# Patient Record
Sex: Female | Born: 1990 | Race: White | Hispanic: Yes | Marital: Single | Smoking: Current some day smoker
Health system: Southern US, Community
[De-identification: ages and names within clinical notes are randomized; demographics above are authoritative.]

## PROBLEM LIST (undated history)

## (undated) ENCOUNTER — Inpatient Hospital Stay (HOSPITAL_COMMUNITY): Payer: Self-pay

## (undated) DIAGNOSIS — M722 Plantar fascial fibromatosis: Secondary | ICD-10-CM

## (undated) DIAGNOSIS — M7120 Synovial cyst of popliteal space [Baker], unspecified knee: Secondary | ICD-10-CM

## (undated) DIAGNOSIS — Z789 Other specified health status: Secondary | ICD-10-CM

## (undated) DIAGNOSIS — F419 Anxiety disorder, unspecified: Secondary | ICD-10-CM

## (undated) HISTORY — PX: NO PAST SURGERIES: SHX2092

## (undated) HISTORY — DX: Synovial cyst of popliteal space (Baker), unspecified knee: M71.20

## (undated) HISTORY — PX: CHOLECYSTECTOMY: SHX55

## (undated) HISTORY — DX: Anxiety disorder, unspecified: F41.9

## (undated) HISTORY — PX: OTHER SURGICAL HISTORY: SHX169

## (undated) HISTORY — DX: Plantar fascial fibromatosis: M72.2

---
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
## 2009-08-20 ENCOUNTER — Other Ambulatory Visit: Admission: RE | Admit: 2009-08-20 | Discharge: 2009-08-20 | Payer: Self-pay | Admitting: Unknown Physician Specialty

## 2010-06-27 NOTE — Op Note (Signed)
[name], [name]             ACCOUNT NO.:  [id number]   MEDICAL RECORD NO.:  [id number]          PATIENT TYPE:  AMB   LOCATION:  DAY                          FACILITY:  [facility]   PHYSICIAN:  Wilmon Arms. Corliss Skains, M.D. DATE OF BIRTH:  10-May-1990   DATE OF PROCEDURE:  03/13/2005  DATE OF DISCHARGE:                                 OPERATIVE REPORT   PREOPERATIVE DIAGNOSES:  1.  Cholelithiasis,  2.  Recent gallstone pancreatitis.   POSTOPERATIVE DIAGNOSES:  1.  Cholelithiasis,  2.  Recent gallstone pancreatitis.   PROCEDURE PERFORMED:  Laparoscopic cholecystectomy with intraoperative  cholangiogram.   SURGEON:  Wilmon Arms. Corliss Skains, M.D.   ASSISTANT:  Adolph Pollack, M.D.   ANESTHESIA:  General endotracheal.   INDICATIONS:  The patient is a 20 year old female who recently developed  severe right upper quadrant epigastric abdominal pain associated with some  subjective fever. The whites of her eyes also turned yellowish. She was  evaluated by her family physician and was noted to have an elevated  bilirubin as well as high amylase and lipase. Her Helicobacter pylori titer  was also positive. She was treated with oral antibiotics and proton pump  inhibitors. Her liver functions as well as amylase and lipase have  fortunately return to normal. She continues to have some right upper  quadrant pain with occasional nausea and vomiting, but this has slightly  improved. Ultrasound showed a large number of small gallstones with no  gallbladder wall thickening.   DESCRIPTION OF PROCEDURE:  The patient was brought to the operating room and  placed in the supine position on the operating room table. After an adequate  level of general endotracheal anesthesia was obtained, the patient's abdomen  was prepped with Betadine and draped in a sterile fashion. A time-out was  taken to ensure the proper patient and proper procedure. The area below her  umbilicus was infiltrated with 0.25%  Marcaine. A transverse incision was  made below her umbilicus and dissection was carried down to the fascia. The  fascia was grasped with Kocher clamps and elevated. A vertical incision was  made. The peritoneal cavity was entered bluntly. A pursestring suture of #0  Vicryl was placed around the fascial opening. A Hasson cannula was inserted  and secured with the pursestring suture. Pneumoperitoneum was obtained by  insufflating CO2 and maintaining maximal pressure of 15 mmHg. The patient  was rotated into reverse Trendelenburg position slightly to her left. The  video laparoscope was inserted and a quick visual examination of the abdomen  showed a noninflamed appearing gallbladder, normal appearing stomach and  spleen. The patient had some physiologic fluid in the pelvis. A 10-mm port  was placed in the subxiphoid position in the midline. Two 5-mm ports were  placed in the right upper quadrant. The gallbladder was grasped with clamps  and elevated up over the edge of the liver. The gallbladder was very long  and contained some stones. The peritoneum around the hilum of the  gallbladder was opened. The cystic duct was identified and circumferentially  dissected. This was ligated with a clip distally. A  small opening was  created on the cystic duct and a Cook cholangiogram catheter was brought  through a separate stab incision and inserted the cystic duct. It was  secured with a clip and a cholangiogram was taken. This showed a tortuous  cystic duct with good flow proximally and distally in the common bile duct  and the duodenum.  There was some question of a possible filling defect just  below the cystic duct, common duct junction, but this appeared to be  overlying gas. The cholangiogram catheter was then flushed with saline and  the cholangiogram was then repeated. No further filling defect was noted.  The cholangiogram catheter was then removed. The cystic duct was ligated  with clips  and divided. Two branches of the cystic artery were ligated with  clips and divided. Cautery was then used to dissect the gallbladder away  from the liver bed. A small portion of liver parenchyma was taken with the  gallbladder. Once the gallbladder was removed, cautery was used to achieve  hemostasis in the liver bed. The pieces of Surgicel were placed in the liver  bed. The gallbladder was placed in an EndoCatch sac and removed through the  umbilical port. The irrigant was suctioned out from the right upper  quadrant. Pneumoperitoneum was released as the ports were removed under  direct vision. The pursestring suture was used to close the umbilical  fascia. The wounds were all closed with 4-0 Monocryl in subcuticular  fashion. Steri-Strips and clean dressings were applied. The patient was  extubated and brought to the recovery room in stable condition. All sponge,  instrument and needle counts were correct.      Wilmon Arms. Tsuei, M.D.  Electronically Signed     MKT/MEDQ  D:  03/13/2005  T:  03/13/2005  Job:  [id number]

## 2010-11-12 ENCOUNTER — Emergency Department (HOSPITAL_COMMUNITY)
Admission: EM | Admit: 2010-11-12 | Discharge: 2010-11-12 | Disposition: A | Discharge status: Home / Self Care | Payer: Self-pay | Attending: Emergency Medicine | Admitting: Emergency Medicine

## 2010-11-12 ENCOUNTER — Emergency Department (HOSPITAL_COMMUNITY): Payer: Self-pay

## 2010-11-12 DIAGNOSIS — R509 Fever, unspecified: Secondary | ICD-10-CM | POA: Insufficient documentation

## 2010-11-12 DIAGNOSIS — R071 Chest pain on breathing: Secondary | ICD-10-CM | POA: Insufficient documentation

## 2010-11-12 LAB — DIFFERENTIAL
Basophils Relative: 0 % (ref 0–1)
Eosinophils Relative: 1 % (ref 0–5)
Lymphocytes Relative: 15 % (ref 12–46)
Neutrophils Relative %: 75 % (ref 43–77)

## 2010-11-12 LAB — D-DIMER, QUANTITATIVE: D-Dimer, Quant: 1.63 ug/mL-FEU — ABNORMAL HIGH (ref 0.00–0.48)

## 2010-11-12 LAB — CBC
HCT: 33.7 % — ABNORMAL LOW (ref 36.0–46.0)
Hemoglobin: 12 g/dL (ref 12.0–15.0)
MCH: 29.8 pg (ref 26.0–34.0)
MCHC: 35.6 g/dL (ref 30.0–36.0)
MCV: 83.6 fL (ref 78.0–100.0)
Platelets: 160 10*3/uL (ref 150–400)

## 2010-11-12 LAB — POCT I-STAT, CHEM 8
BUN: 11 mg/dL (ref 6–23)
Calcium, Ion: 1.14 mmol/L (ref 1.12–1.32)
Chloride: 104 mEq/L (ref 96–112)
HCT: 36 % (ref 36.0–46.0)
Hemoglobin: 12.2 g/dL (ref 12.0–15.0)

## 2010-11-12 MED ORDER — IOHEXOL 300 MG/ML  SOLN: 75.0000 mL | Freq: Once | INTRAMUSCULAR | Status: DC | PRN

## 2011-01-18 ENCOUNTER — Encounter: Payer: Self-pay | Admitting: *Deleted

## 2011-01-18 ENCOUNTER — Emergency Department (HOSPITAL_COMMUNITY)
Admission: EM | Admit: 2011-01-18 | Discharge: 2011-01-18 | Disposition: A | Discharge status: Home / Self Care | Payer: Self-pay | Attending: Emergency Medicine | Admitting: Emergency Medicine

## 2011-01-18 DIAGNOSIS — F17200 Nicotine dependence, unspecified, uncomplicated: Secondary | ICD-10-CM | POA: Insufficient documentation

## 2011-01-18 DIAGNOSIS — K089 Disorder of teeth and supporting structures, unspecified: Secondary | ICD-10-CM | POA: Insufficient documentation

## 2011-01-18 DIAGNOSIS — K047 Periapical abscess without sinus: Secondary | ICD-10-CM | POA: Insufficient documentation

## 2011-01-18 DIAGNOSIS — K029 Dental caries, unspecified: Secondary | ICD-10-CM | POA: Insufficient documentation

## 2011-01-18 MED ORDER — AMOXICILLIN 500 MG PO CAPS: 500.0000 mg | ORAL_CAPSULE | Freq: Three times a day (TID) | ORAL | Status: AC

## 2011-01-18 MED ORDER — HYDROCODONE-ACETAMINOPHEN 5-500 MG PO TABS: 1.0000 | ORAL_TABLET | Freq: Four times a day (QID) | ORAL | Status: AC | PRN

## 2011-01-18 MED ORDER — IBUPROFEN 200 MG PO TABS
600.0000 mg | ORAL_TABLET | Freq: Once | ORAL | Status: AC
  Administered 2011-01-18: 600 mg via ORAL
  Filled 2011-01-18: qty 3

## 2011-01-18 MED ORDER — HYDROCODONE-ACETAMINOPHEN 5-325 MG PO TABS
1.0000 | ORAL_TABLET | Freq: Once | ORAL | Status: AC
  Administered 2011-01-18: 1 via ORAL
  Filled 2011-01-18: qty 1

## 2011-01-18 NOTE — ED Notes (Signed)
To ed for eval of left upper and lower teeth pains. States she has had the pain for the past cple months but now worse.

## 2011-01-18 NOTE — ED Provider Notes (Signed)
History     CSN: [id number] Arrival date & time: 01/18/2011 10:51 AM   First MD Initiated Contact with Patient 01/18/11 1156      Chief Complaint  Patient presents with  . Dental Pain    (Consider location/radiation/quality/duration/timing/severity/associated sxs/prior treatment) Patient is a 20 y.o. female presenting with tooth pain. The history is provided by the patient.  Dental PainPrimary symptoms do not include fever or sore throat.  pt c/o dental pain for past week. Constant, dull, non radiating pain, worse w eating. No injury. No fever. No trouble breathing or swallowing. Has no local dentist.  History reviewed. No pertinent past medical history.  History reviewed. No pertinent past surgical history.  History reviewed. No pertinent family history.  History  Substance Use Topics  . Smoking status: Current Everyday Smoker -- 0.5 packs/day    Types: Cigarettes  . Smokeless tobacco: Not on file  . Alcohol Use: No    OB History    Grav Para Term Preterm Abortions TAB SAB Ect Mult Living                  Review of Systems  Constitutional: Negative for fever.  HENT: Negative for sore throat.   Skin: Negative for rash.    Allergies  Review of patient's allergies indicates no known allergies.  Home Medications  No current outpatient prescriptions on file.  There were no vitals taken for this visit.  Physical Exam  Nursing note and vitals reviewed. Constitutional: She appears well-developed and well-nourished. No distress.  HENT:  Mouth/Throat: Oropharynx is clear and moist.       Right lower dental decay, assoc gum swelling and tenderness. No trismus. No swelling or tenderness to floor of mouth or neck. No facial swelling or cellulitis.   Eyes: Conjunctivae are normal. No scleral icterus.  Neck: Neck supple. No tracheal deviation present.  Cardiovascular: Normal rate.   Pulmonary/Chest: Effort normal. No respiratory distress.  Abdominal: Normal appearance.  She exhibits no distension.  Musculoskeletal: She exhibits no edema.  Neurological: She is alert. Coordination abnormal.  Skin: Skin is warm and dry. No rash noted.  Psychiatric: She has a normal mood and affect.    ED Course  Procedures (including critical care time)    MDM  Pt has ride, no meds pta, confirmed nkda. vicodin po. Motrin po.         Suzi Roots, MD 01/18/11 [id number]

## 2011-03-11 LAB — OB RESULTS CONSOLE GBS: GBS: NEGATIVE

## 2011-08-19 ENCOUNTER — Other Ambulatory Visit (HOSPITAL_COMMUNITY): Payer: Self-pay | Admitting: Family

## 2011-08-19 ENCOUNTER — Other Ambulatory Visit: Payer: Self-pay | Admitting: Family

## 2011-08-19 DIAGNOSIS — Z3689 Encounter for other specified antenatal screening: Secondary | ICD-10-CM

## 2011-08-19 DIAGNOSIS — Z3682 Encounter for antenatal screening for nuchal translucency: Secondary | ICD-10-CM

## 2011-08-19 LAB — OB RESULTS CONSOLE GC/CHLAMYDIA
Chlamydia: NEGATIVE
Gonorrhea: NEGATIVE

## 2011-08-19 LAB — OB RESULTS CONSOLE RUBELLA ANTIBODY, IGM: Rubella: NON-IMMUNE/NOT IMMUNE

## 2011-08-19 LAB — OB RESULTS CONSOLE ANTIBODY SCREEN: Antibody Screen: NEGATIVE

## 2011-08-19 LAB — OB RESULTS CONSOLE ABO/RH

## 2011-09-08 ENCOUNTER — Encounter (HOSPITAL_COMMUNITY): Payer: Self-pay | Admitting: Family

## 2011-09-10 ENCOUNTER — Inpatient Hospital Stay (HOSPITAL_COMMUNITY)
Admission: AD | Admit: 2011-09-10 | Discharge: 2011-09-10 | Disposition: A | Discharge status: Home / Self Care | Payer: Medicaid Other | Source: Ambulatory Visit | Attending: Obstetrics and Gynecology | Admitting: Obstetrics and Gynecology

## 2011-09-10 ENCOUNTER — Encounter (HOSPITAL_COMMUNITY): Payer: Self-pay | Admitting: *Deleted

## 2011-09-10 DIAGNOSIS — R109 Unspecified abdominal pain: Secondary | ICD-10-CM | POA: Insufficient documentation

## 2011-09-10 DIAGNOSIS — K219 Gastro-esophageal reflux disease without esophagitis: Secondary | ICD-10-CM

## 2011-09-10 DIAGNOSIS — O99891 Other specified diseases and conditions complicating pregnancy: Secondary | ICD-10-CM | POA: Insufficient documentation

## 2011-09-10 HISTORY — DX: Other specified health status: Z78.9

## 2011-09-10 LAB — URINALYSIS, ROUTINE W REFLEX MICROSCOPIC
Ketones, ur: NEGATIVE mg/dL
Urobilinogen, UA: 0.2 mg/dL (ref 0.0–1.0)
pH: 6 (ref 5.0–8.0)

## 2011-09-10 MED ORDER — GI COCKTAIL ~~LOC~~
30.0000 mL | ORAL | Status: AC
  Administered 2011-09-10: 30 mL via ORAL
  Filled 2011-09-10: qty 30

## 2011-09-10 MED ORDER — ONDANSETRON 8 MG PO TBDP
8.0000 mg | ORAL_TABLET | ORAL | Status: AC
  Administered 2011-09-10: 8 mg via ORAL
  Filled 2011-09-10: qty 1

## 2011-09-10 MED ORDER — RANITIDINE HCL 150 MG PO TABS: 150.0000 mg | ORAL_TABLET | Freq: Two times a day (BID) | ORAL | Status: DC

## 2011-09-10 NOTE — MAU Provider Note (Signed)
[employer] y.o.G1P0 @[redacted]w[redacted]d  by LMP Chief Complaint  Patient presents with  . Abdominal Pain     First Provider Initiated Contact with Patient 09/10/11 0359      SUBJECTIVE  HPI: Pt presents with abdominal pain indicated as above her umbilicus described as sharp and woke her up from sleeping about an hour ago.  She reports eating fruit just before going to bed at 9 pm.  She denies lower abdominal pain/cramping, LOF, vaginal bleeding, vaginal itching/burning, urinary symptoms, h/a, dizziness, n/v, or fever/chills.   Pt has started prenatal care at [facility].  Pt vomited x1 in MAU  Past Medical History  Diagnosis Date  . No pertinent past medical history    Past Surgical History  Procedure Date  . Cholecystectomy    History   Social History  . Marital Status: Single    Spouse Name: N/A    Number of Children: N/A  . Years of Education: N/A   Occupational History  . Not on file.   Social History Main Topics  . Smoking status: Former Smoker -- 0.5 packs/day    Types: Cigarettes  . Smokeless tobacco: Not on file  . Alcohol Use: No  . Drug Use: No  . Sexually Active: Yes   Other Topics Concern  . Not on file   Social History Narrative  . No narrative on file   No current facility-administered medications on file prior to encounter.   No current outpatient prescriptions on file prior to encounter.   No Known Allergies  ROS: Pertinent items in HPI  OBJECTIVE Blood pressure 97/63, pulse 73, temperature 98.5 F (36.9 C), temperature source Oral, resp. rate 20, height 5' 8.5" (1.74 m), weight 76.658 kg (169 lb), last menstrual period 11/05/2010, SpO2 100.00%.  GENERAL: Well-developed, well-nourished female in no acute distress.  HEENT: Normocephalic, good dentition HEART: normal rate RESP: normal effort ABDOMEN: Soft, nontender EXTREMITIES: Nontender, no edema NEURO: Alert and oriented Pelvic exam deferred  FHT 164  LAB RESULTS  Results for orders placed  during the hospital encounter of 09/10/11 (from the past 24 hour(s))  URINALYSIS, ROUTINE W REFLEX MICROSCOPIC     Status: Abnormal   Collection Time   09/10/11  3:33 AM      Component Value Range   Color, Urine YELLOW  YELLOW   APPearance CLEAR  CLEAR   Specific Gravity, Urine >1.030 (*) 1.005 - 1.030   pH 6.0  5.0 - 8.0   Glucose, UA NEGATIVE  NEGATIVE mg/dL   Hgb urine dipstick NEGATIVE  NEGATIVE   Bilirubin Urine NEGATIVE  NEGATIVE   Ketones, ur NEGATIVE  NEGATIVE mg/dL   Protein, ur NEGATIVE  NEGATIVE mg/dL   Urobilinogen, UA 0.2  0.0 - 1.0 mg/dL   Nitrite NEGATIVE  NEGATIVE   Leukocytes, UA NEGATIVE  NEGATIVE     ASSESSMENT Acid reflux in pregnancy   PLAN GI Cocktail x1 in MAU with complete relief but pt vomited 30 min after dose Zofran ODT x1 dose D/C home Zantac 150 mg BID PRN  F/U with prenatal care at [facility] as scheduled Return to MAU as needed   Medication List  As of 09/10/2011  4:09 AM   ASK your doctor about these medications         prenatal vitamin w/FE, FA 27-1 MG Tabs   Take 1 tablet by mouth daily.            LEFTWICH-KIRBY, Cezar Misiaszek 09/10/2011 4:09 AM

## 2011-09-10 NOTE — MAU Note (Signed)
Pt reports lower abd cramping that awakened her from sleep. Denies bleeding or dysuria

## 2011-09-14 NOTE — MAU Provider Note (Signed)
Attestation of Attending Supervision of Advanced Practitioner: Evaluation and management procedures were performed by the PA/NP/[name]/OB Fellow under my supervision/collaboration. Chart reviewed and agree with management and plan.  Khaliah Barnick V 09/14/2011 6:48 AM

## 2011-09-16 ENCOUNTER — Ambulatory Visit (HOSPITAL_COMMUNITY): Payer: Medicaid Other

## 2011-09-16 ENCOUNTER — Other Ambulatory Visit (HOSPITAL_COMMUNITY): Payer: Self-pay

## 2011-09-17 ENCOUNTER — Ambulatory Visit (HOSPITAL_COMMUNITY)
Admission: RE | Admit: 2011-09-17 | Discharge: 2011-09-17 | Disposition: A | Discharge status: Home / Self Care | Payer: Medicaid Other | Source: Ambulatory Visit | Attending: Family | Admitting: Family

## 2011-09-17 ENCOUNTER — Other Ambulatory Visit: Payer: Self-pay

## 2011-09-17 ENCOUNTER — Encounter (HOSPITAL_COMMUNITY): Payer: Self-pay

## 2011-09-17 DIAGNOSIS — Z3682 Encounter for antenatal screening for nuchal translucency: Secondary | ICD-10-CM

## 2011-09-17 DIAGNOSIS — O352XX0 Maternal care for (suspected) hereditary disease in fetus, not applicable or unspecified: Secondary | ICD-10-CM | POA: Insufficient documentation

## 2011-09-17 DIAGNOSIS — O3510X0 Maternal care for (suspected) chromosomal abnormality in fetus, unspecified, not applicable or unspecified: Secondary | ICD-10-CM | POA: Insufficient documentation

## 2011-09-17 DIAGNOSIS — O351XX0 Maternal care for (suspected) chromosomal abnormality in fetus, not applicable or unspecified: Secondary | ICD-10-CM | POA: Insufficient documentation

## 2011-09-17 DIAGNOSIS — Z3689 Encounter for other specified antenatal screening: Secondary | ICD-10-CM | POA: Insufficient documentation

## 2011-09-18 ENCOUNTER — Encounter (HOSPITAL_COMMUNITY): Payer: Self-pay

## 2011-09-18 NOTE — Progress Notes (Addendum)
Genetic Counseling  High-Risk Gestation Note  Appointment Date:  09/17/2011 Referred By: Kendrick Fries, FNP Date of Birth:  January 16, 1991    Pregnancy History: G1P0 Estimated Date of Delivery: 03/30/12 Estimated Gestational Age: [redacted]w[redacted]d Attending: Particia Nearing, MD     Ms. [name] was seen for genetic counseling because of a family history of Down syndrome. The patient was accompanied by her cousin and her cousin's infant daughter.      Both family histories were reviewed and found to be contributory for Down syndrome in the patient's maternal half-nephew (her maternal half-brother's son). This individual is currently 15 years old. He reportedly has developmental delay. He reportedly had what was described to sound like anal atresia or stenosis at birth, for which he had surgical correction. The specific type of Down syndrome was not known, but the patient reported that he has one extra chromosome and that it is "not genetic."   We discussed chromosomes, nondisjunction, and specifically reviewed features of Down syndrome (trisomy 21). We discussed that 95% of cases of Down syndrome are not inherited and are the result of non-disjunction (trisomy 56).  Three to 4% of cases of Down syndrome are the result of a translocation involving chromosome #21, which can be sporadic or inherited from a parent.  We discussed the option of chromosome analysis to determine if an individual is a carrier of a balanced translocation involving chromosome #21.  If an individual carries a balanced translocation involving chromosome #21, then the chance to have a baby with Down syndrome would be greater than the maternal age-related risk.  We discussed that this relative's Down syndrome was most likely sporadic, given the reported family history. In the case that this relative's Down syndrome occurred sporadically, recurrence risk for Down syndrome in the current pregnancy would not be expected to be increased above the  patient's a priori age related risk. We discussed that based on age alone, the pregnancy is not considered at increased risk for fetal aneuploidy. In the case that this relative's Down syndrome was inherited or in the case of a different underlying condition, recurrence risk estimate may change.   We reviewed available screening options for Down syndrome in the pregnancy including First screen, Quad screen, noninvasive prenatal testing (NIPT), and detailed ultrasound. She understands that screening tests are used to modify a patient's a priori risk for aneuploidy, typically based on age.  This estimate provides a pregnancy specific risk assessment. In addition, we discussed that ~50-80% of fetuses with Down syndrome and up to 90-95% of fetuses with trisomy 18/13, when well visualized, have detectable anomalies or soft markers by detailed ultrasound (~18+ weeks gestation).  She was also counseled regarding diagnostic testing via amniocentesis.  We reviewed the approximate 1 in 300-500 risk for complications for amniocentesis, including spontaneous pregnancy loss. We reviewed that the risk of complications from amniocentesis is higher than the patient's age-related risk for fetal aneuploidy. We discussed the risks, limitations, and benefits of each screening and testing option. After careful consideration, Ms. [name] elected to proceed with first trimester screening at the time of today's visit. She stated that she may consider NIPT based on results of first trimester screening but declined amniocentesis. Complete ultrasound was performed at the time of today's visit. Ultrasound results reported separately.   Additionally, the father of the pregnancy has a previous daughter with horseshoe kidney. She is currently 22 years old and otherwise healthy. Horseshoe kidney is estimated to occur in 1 in 400 to 1 in  800 births, with the majority of sources indicating 1 in 500 as the most likely prevalence. It is observed to  occur more commonly in males. Typically in fetal development, the two kidneys ascend to their positions in the abdominal region. In horseshoe kidney, the two kidneys are fused together at the lower end, forming a "U" shape, or "horseshoe" shape. Horseshoe kidney can be isolated or occur as one feature in combination with other disorders. In the case of isolated horseshoe kidney, familial occurrence of horseshoe kidney has rarely been observed. Thus, in the case of isolated occurrence of horseshoe kidney, recurrence risk would likely be close to the general population chance but may be slightly increased. In the case of an underlying hereditary cause, recurrence risk estimate would depend on the specific underlying cause. We discussed the targeted ultrasound is available in the second trimester to assess fetal kidneys. The patient understands that ultrasound cannot diagnose or rule out all birth defects prenatally. Without further information regarding the provided family history, an accurate genetic risk cannot be calculated. Further genetic counseling is warranted if more information is obtained.  We discussed information regarding sickle cell anemia (SCA) including the carrier frequency and incidence in the Hispanic population, the availability of carrier testing and prenatal diagnosis if indicated.  In addition, we discussed that hemoglobinopathies are routinely screened for as part of the [place] newborn screening panel.  She declined hemoglobin electrophoresis today.  Ms. [name] was provided with written information regarding cystic fibrosis (CF) including the carrier frequency and incidence in the Hispanic population, the availability of carrier testing and prenatal diagnosis if indicated.  In addition, we discussed that CF is routinely screened for as part of the [place] newborn screening panel.  She declined testing today.   Ms. [name] denied exposure to environmental toxins or chemical agents.  She denied the use of alcohol or tobacco. She reported smoking marijuana daily: once in the morning and once in the evening. The use of marijuana during pregnancy is associated with increased risk for low birth weight and premature delivery.  Therefore, it is recommended that the patient avoid smoking marijuana during pregnancy. She denied significant viral illnesses during the course of her pregnancy. Her medical and surgical histories were noncontributory.   I counseled Ms. [name] regarding the above risks and available options.  The approximate face-to-face time with the genetic counselor was 30 minutes.  Quinn Plowman, MS,  Certified Genetic Counselor  09/18/2011

## 2011-09-19 ENCOUNTER — Encounter (HOSPITAL_COMMUNITY): Payer: Self-pay | Admitting: *Deleted

## 2011-09-19 ENCOUNTER — Inpatient Hospital Stay (HOSPITAL_COMMUNITY)
Admission: AD | Admit: 2011-09-19 | Discharge: 2011-09-19 | Disposition: A | Discharge status: Home / Self Care | Payer: Medicaid Other | Source: Ambulatory Visit | Attending: Obstetrics & Gynecology | Admitting: Obstetrics & Gynecology

## 2011-09-19 DIAGNOSIS — K219 Gastro-esophageal reflux disease without esophagitis: Secondary | ICD-10-CM | POA: Insufficient documentation

## 2011-09-19 DIAGNOSIS — O210 Mild hyperemesis gravidarum: Secondary | ICD-10-CM | POA: Insufficient documentation

## 2011-09-19 DIAGNOSIS — O219 Vomiting of pregnancy, unspecified: Secondary | ICD-10-CM

## 2011-09-19 DIAGNOSIS — O99891 Other specified diseases and conditions complicating pregnancy: Secondary | ICD-10-CM | POA: Insufficient documentation

## 2011-09-19 LAB — URINALYSIS, ROUTINE W REFLEX MICROSCOPIC
Bilirubin Urine: NEGATIVE
Glucose, UA: NEGATIVE mg/dL
Specific Gravity, Urine: 1.025 (ref 1.005–1.030)
Urobilinogen, UA: 2 mg/dL — ABNORMAL HIGH (ref 0.0–1.0)

## 2011-09-19 MED ORDER — ONDANSETRON 8 MG PO TBDP
8.0000 mg | ORAL_TABLET | Freq: Once | ORAL | Status: AC
  Administered 2011-09-19: 8 mg via ORAL
  Filled 2011-09-19: qty 1

## 2011-09-19 MED ORDER — ONDANSETRON 8 MG PO TBDP: 8.0000 mg | ORAL_TABLET | Freq: Three times a day (TID) | ORAL | Status: AC | PRN

## 2011-09-19 NOTE — Progress Notes (Signed)
Written and verbal d/c instructions given and understanding voiced. 

## 2011-09-19 NOTE — MAU Note (Signed)
Pt states, " I have been vomiting for the past week everyday, and I had blood in the vomit. I have thrown up once today ."

## 2011-09-19 NOTE — MAU Provider Note (Signed)
History     CSN: [id number]  Arrival date and time: 09/19/11 2122   First Provider Initiated Contact with Patient 09/19/11 2153      Chief Complaint  Patient presents with  . Morning Sickness   HPI This is a 21 y.o. female at [redacted]w[redacted]d who presents with c/o nausea and vomiting. Does not vomit all day, is able to keep some things down. Denies pain or bleeding.  Does not have any meds for nausea. Plans to go to HD for now, but wants private MD.  RN Note: Pt states, " I have been vomiting for the past week everyday, and I had blood in the vomit. I have thrown up once today ."   OB History    Grav Para Term Preterm Abortions TAB SAB Ect Mult Living   1               Past Medical History  Diagnosis Date  . No pertinent past medical history     Past Surgical History  Procedure Date  . Cholecystectomy     Family History  Problem Relation Age of Onset  . Down syndrome Other     maternal half-nephew  . Other Neg Hx     History  Substance Use Topics  . Smoking status: Former Smoker -- 0.5 packs/day    Types: Cigarettes  . Smokeless tobacco: Not on file  . Alcohol Use: No    Allergies: No Known Allergies  Prescriptions prior to admission  Medication Sig Dispense Refill  . prenatal vitamin w/FE, FA (PRENATAL 1 + 1) 27-1 MG TABS Take 1 tablet by mouth daily.      . ranitidine (ZANTAC) 150 MG tablet Take 1 tablet (150 mg total) by mouth 2 (two) times daily.  60 tablet  5    ROS Nausea and vomiting with acid reflux No fever No bleeding or pain  Physical Exam   Blood pressure 122/75, pulse 104, temperature 98.2 F (36.8 C), temperature source Oral, resp. rate 18, height 5\' 8"  (1.727 m), weight 168 lb 6 oz (76.374 kg), last menstrual period 06/24/2011.  Physical Exam  Constitutional: She is oriented to person, place, and time. She appears well-developed. No distress.  Cardiovascular: Normal rate.   Respiratory: Effort normal.  GI: Soft. She exhibits no distension  and no mass. There is no tenderness. There is no rebound and no guarding.       + FHTs per RN   Musculoskeletal: Normal range of motion.  Neurological: She is alert and oriented to person, place, and time.  Skin: Skin is warm and dry.  Psychiatric: She has a normal mood and affect.   Results for orders placed during the hospital encounter of 09/19/11 (from the past 24 hour(s))  URINALYSIS, ROUTINE W REFLEX MICROSCOPIC     Status: Abnormal   Collection Time   09/19/11  9:30 PM      Component Value Range   Color, Urine YELLOW  YELLOW   APPearance CLEAR  CLEAR   Specific Gravity, Urine 1.025  1.005 - 1.030   pH 6.0  5.0 - 8.0   Glucose, UA NEGATIVE  NEGATIVE mg/dL   Hgb urine dipstick NEGATIVE  NEGATIVE   Bilirubin Urine NEGATIVE  NEGATIVE   Ketones, ur NEGATIVE  NEGATIVE mg/dL   Protein, ur NEGATIVE  NEGATIVE mg/dL   Urobilinogen, UA 2.0 (*) 0.0 - 1.0 mg/dL   Nitrite NEGATIVE  NEGATIVE   Leukocytes, UA NEGATIVE  NEGATIVE    MAU Course  Procedures   Assessment and Plan  A:  SIUP at [redacted]w[redacted]d      Nausea and vomiting of pregnancy      Acid Reflux      No overt dehydration  P:  Discussed N/V       Wants to use Zantac OTC for reflux       Rx Zofran       List of OB providers given  [facility] 09/19/2011, 10:00 PM

## 2011-09-20 NOTE — MAU Provider Note (Signed)
Medical Screening exam and patient care preformed by advanced practice provider.  Agree with the above management.  

## 2011-10-28 ENCOUNTER — Ambulatory Visit (HOSPITAL_COMMUNITY)
Admission: RE | Admit: 2011-10-28 | Discharge: 2011-10-28 | Disposition: A | Discharge status: Home / Self Care | Payer: Medicaid Other | Source: Ambulatory Visit | Attending: Family | Admitting: Family

## 2011-10-28 DIAGNOSIS — Z363 Encounter for antenatal screening for malformations: Secondary | ICD-10-CM | POA: Insufficient documentation

## 2011-10-28 DIAGNOSIS — Z3689 Encounter for other specified antenatal screening: Secondary | ICD-10-CM

## 2011-10-28 DIAGNOSIS — O358XX0 Maternal care for other (suspected) fetal abnormality and damage, not applicable or unspecified: Secondary | ICD-10-CM | POA: Insufficient documentation

## 2011-10-28 DIAGNOSIS — Z1389 Encounter for screening for other disorder: Secondary | ICD-10-CM | POA: Insufficient documentation

## 2011-10-28 DIAGNOSIS — O352XX0 Maternal care for (suspected) hereditary disease in fetus, not applicable or unspecified: Secondary | ICD-10-CM | POA: Insufficient documentation

## 2011-11-10 ENCOUNTER — Encounter (HOSPITAL_COMMUNITY): Payer: Self-pay

## 2011-11-10 ENCOUNTER — Inpatient Hospital Stay (HOSPITAL_COMMUNITY)
Admission: AD | Admit: 2011-11-10 | Discharge: 2011-11-10 | Disposition: A | Discharge status: Home / Self Care | Payer: Medicaid Other | Source: Ambulatory Visit | Attending: Family Medicine | Admitting: Family Medicine

## 2011-11-10 DIAGNOSIS — M549 Dorsalgia, unspecified: Secondary | ICD-10-CM

## 2011-11-10 DIAGNOSIS — O26899 Other specified pregnancy related conditions, unspecified trimester: Secondary | ICD-10-CM

## 2011-11-10 DIAGNOSIS — O99891 Other specified diseases and conditions complicating pregnancy: Secondary | ICD-10-CM | POA: Insufficient documentation

## 2011-11-10 LAB — URINALYSIS, ROUTINE W REFLEX MICROSCOPIC
Nitrite: NEGATIVE
pH: 6.5 (ref 5.0–8.0)

## 2011-11-10 MED ORDER — CYCLOBENZAPRINE HCL 10 MG PO TABS: 5.0000 mg | ORAL_TABLET | Freq: Two times a day (BID) | ORAL | Status: DC | PRN

## 2011-11-10 NOTE — MAU Note (Signed)
Patient reports upper back discomfort after raising a window this happened a couple of hours ago.

## 2011-11-10 NOTE — MAU Provider Note (Signed)
History     CSN: [id number]  Arrival date and time: 11/10/11 1420   First Provider Initiated Contact with Patient 11/10/11 1443      Chief Complaint  Patient presents with  . Back Pain   HPI [name] is a 21 y.o. female @ [redacted]w[redacted]d gestation who presents to MAU with back pain. The pain started today. Patient had been picking up, cleaning up some last night. The pain is located in the upper back. Denies any other problems. She rates the pain as 8/10. She denies vaginal bleeding or leaking of fluid, denies abdominal pain. Warm shower helps the pain. Walking and movement makes it worse. The history was provided by the patient.  OB History    Grav Para Term Preterm Abortions TAB SAB Ect Mult Living   1               Past Medical History  Diagnosis Date  . No pertinent past medical history     Past Surgical History  Procedure Date  . Cholecystectomy   . No past surgeries     Family History  Problem Relation Age of Onset  . Down syndrome Other     maternal half-nephew  . Other Neg Hx     History  Substance Use Topics  . Smoking status: Former Smoker -- 0.5 packs/day    Types: Cigarettes  . Smokeless tobacco: Not on file  . Alcohol Use: No    Allergies: No Known Allergies  Prescriptions prior to admission  Medication Sig Dispense Refill  . prenatal vitamin w/FE, FA (PRENATAL 1 + 1) 27-1 MG TABS Take 1 tablet by mouth daily.      . ranitidine (ZANTAC) 150 MG tablet Take 1 tablet (150 mg total) by mouth 2 (two) times daily.  60 tablet  5    Review of Systems  Constitutional: Negative for fever, chills and weight loss.  HENT: Negative for nosebleeds, congestion, sore throat and neck pain.   Eyes: Negative for blurred vision, double vision, photophobia and pain.  Respiratory: Negative for cough, shortness of breath and wheezing.   Cardiovascular: Negative for chest pain and palpitations.  Gastrointestinal: Negative for heartburn, nausea, vomiting, abdominal pain,  diarrhea and constipation.  Genitourinary: Positive for frequency. Negative for dysuria and urgency.  Musculoskeletal: Positive for back pain. Negative for joint pain.  Skin: Negative for itching and rash.  Neurological: Negative for dizziness, seizures and headaches.  Psychiatric/Behavioral: Negative for depression. The patient is not nervous/anxious and does not have insomnia.    Physical Exam   Blood pressure 113/56, pulse 88, temperature 97.9 F (36.6 C), temperature source Oral, resp. rate 16, last menstrual period 06/24/2011.  Physical Exam  Nursing note and vitals reviewed. Constitutional: She is oriented to person, place, and time. She appears well-developed and well-nourished. No distress.  HENT:  Head: Normocephalic and atraumatic.  Eyes: EOM are normal.  Neck: Neck supple.  Cardiovascular: Normal rate.   Respiratory: Effort normal.  GI: Soft. There is no tenderness.  Musculoskeletal:       Pain in left thoracic area with bending forward and side to side.    Neurological: She is alert and oriented to person, place, and time. She has normal strength and normal reflexes. No cranial nerve deficit or sensory deficit. Coordination normal.  Skin: Skin is warm and dry.  Psychiatric: She has a normal mood and affect. Her behavior is normal. Judgment and thought content normal.   Results for orders placed during the hospital  encounter of 11/10/11 (from the past 24 hour(s))  URINALYSIS, ROUTINE W REFLEX MICROSCOPIC     Status: Normal   Collection Time   11/10/11  3:05 PM      Component Value Range   Color, Urine YELLOW  YELLOW   APPearance CLEAR  CLEAR   Specific Gravity, Urine 1.025  1.005 - 1.030   pH 6.5  5.0 - 8.0   Glucose, UA NEGATIVE  NEGATIVE mg/dL   Hgb urine dipstick NEGATIVE  NEGATIVE   Bilirubin Urine NEGATIVE  NEGATIVE   Ketones, ur NEGATIVE  NEGATIVE mg/dL   Protein, ur NEGATIVE  NEGATIVE mg/dL   Urobilinogen, UA 0.2  0.0 - 1.0 mg/dL   Nitrite NEGATIVE   NEGATIVE   Leukocytes, UA NEGATIVE  NEGATIVE   Assessment: 21 y.o. female @ [redacted]w[redacted]d gestation with back pain   Thoracic strain  Plan:  Rx Flexeril   Tylenol prn   Follow up with OB, return as needed Procedures   NEESE,HOPE, RN, FNP, [facility] 11/10/2011, 2:44 PM

## 2011-11-10 NOTE — MAU Provider Note (Signed)
Chart reviewed and agree with management and plan.  

## 2012-02-10 DIAGNOSIS — N83209 Unspecified ovarian cyst, unspecified side: Secondary | ICD-10-CM

## 2012-02-10 HISTORY — DX: Unspecified ovarian cyst, unspecified side: N83.209

## 2012-03-10 LAB — OB RESULTS CONSOLE GBS: GBS: NEGATIVE

## 2012-03-11 ENCOUNTER — Inpatient Hospital Stay (HOSPITAL_COMMUNITY)
Admission: AD | Admit: 2012-03-11 | Discharge: 2012-03-11 | Disposition: A | Discharge status: Home / Self Care | Payer: Medicaid Other | Source: Ambulatory Visit | Attending: Obstetrics and Gynecology | Admitting: Obstetrics and Gynecology

## 2012-03-11 ENCOUNTER — Encounter (HOSPITAL_COMMUNITY): Payer: Self-pay | Admitting: *Deleted

## 2012-03-11 DIAGNOSIS — O479 False labor, unspecified: Secondary | ICD-10-CM

## 2012-03-11 DIAGNOSIS — O26859 Spotting complicating pregnancy, unspecified trimester: Secondary | ICD-10-CM | POA: Insufficient documentation

## 2012-03-11 DIAGNOSIS — R109 Unspecified abdominal pain: Secondary | ICD-10-CM | POA: Insufficient documentation

## 2012-03-11 NOTE — Progress Notes (Signed)
Dr Ambrose Mantle notified of pt's arrival and complaints, fern results and vag exam.

## 2012-03-11 NOTE — MAU Provider Note (Signed)
S: 23 y.o. G1P0 @[redacted]w[redacted]d  presents to MAU today with cramping and some pink tinged mucous when wiping.  She reports a spot in her underwear x1 today but no other LOF.  She reports good fetal movement, denies bright red vaginal bleeding, vaginal itching/burning, urinary symptoms, h/a, dizziness, n/v, or fever/chills.   O: BP 122/60  Pulse 81  Temp 98.2 F (36.8 C) (Oral)  Resp 16  Ht 5' 8.75" (1.746 m)  Wt 100.608 kg (221 lb 12.8 oz)  BMI 32.99 kg/m2  SpO2 100%  LMP 06/24/2011   Speculum exam: negative pooling, some white mucous discharge noted, small amount pink tinged mucous from os  Ferning negative  EFM: FHR baseline 150 with moderate variability, positive accels, negative decels  Toco: Irregular, mild to palpation  Cervix 2/60/3, vertex, unchanged from exam in office  A: Intact membranes Normal physiologic discharge Vaginal spotting from recent exam vs mucous plug  P: RN to call Dr Ambrose Mantle D/C home with labor precautions F/U with office Return to MAU as needed  Sharen Counter Certified Nurse-Midwife

## 2012-03-11 NOTE — MAU Note (Signed)
Patient states she started having thick discharge with spotting this am. Having some contractions mostly with moving. No active leaking but has wet spots on panties. Reports good fetal movement.

## 2012-03-13 ENCOUNTER — Encounter (HOSPITAL_COMMUNITY): Payer: Self-pay | Admitting: *Deleted

## 2012-03-13 ENCOUNTER — Inpatient Hospital Stay (HOSPITAL_COMMUNITY)
Admission: AD | Admit: 2012-03-13 | Discharge: 2012-03-17 | DRG: 766 | Disposition: A | Discharge status: Home / Self Care | Payer: Medicaid Other | Source: Ambulatory Visit | Attending: Obstetrics and Gynecology | Admitting: Obstetrics and Gynecology

## 2012-03-13 DIAGNOSIS — O34599 Maternal care for other abnormalities of gravid uterus, unspecified trimester: Secondary | ICD-10-CM | POA: Diagnosis present

## 2012-03-13 DIAGNOSIS — O4290 Premature rupture of membranes, unspecified as to length of time between rupture and onset of labor, unspecified weeks of gestation: Secondary | ICD-10-CM

## 2012-03-13 DIAGNOSIS — D279 Benign neoplasm of unspecified ovary: Secondary | ICD-10-CM | POA: Diagnosis present

## 2012-03-13 LAB — TYPE AND SCREEN
ABO/RH(D): O POS
Antibody Screen: NEGATIVE

## 2012-03-13 LAB — CBC
HCT: 33 % — ABNORMAL LOW (ref 36.0–46.0)
Hemoglobin: 11.5 g/dL — ABNORMAL LOW (ref 12.0–15.0)
RBC: 4 MIL/uL (ref 3.87–5.11)
WBC: 8.8 10*3/uL (ref 4.0–10.5)

## 2012-03-13 LAB — RPR: RPR Ser Ql: NONREACTIVE

## 2012-03-13 MED ORDER — OXYTOCIN 40 UNITS IN LACTATED RINGERS INFUSION - SIMPLE MED: 1.0000 m[IU]/min | INTRAVENOUS | Status: DC

## 2012-03-13 MED ORDER — LIDOCAINE HCL (PF) 1 % IJ SOLN: 30.0000 mL | INTRAMUSCULAR | Status: DC | PRN

## 2012-03-13 MED ORDER — PHENYLEPHRINE 40 MCG/ML (10ML) SYRINGE FOR IV PUSH (FOR BLOOD PRESSURE SUPPORT): 80.0000 ug | PREFILLED_SYRINGE | INTRAVENOUS | Status: DC | PRN

## 2012-03-13 MED ORDER — IBUPROFEN 600 MG PO TABS: 600.0000 mg | ORAL_TABLET | Freq: Four times a day (QID) | ORAL | Status: DC | PRN

## 2012-03-13 MED ORDER — EPHEDRINE 5 MG/ML INJ: 10.0000 mg | INTRAVENOUS | Status: DC | PRN

## 2012-03-13 MED ORDER — FENTANYL 2.5 MCG/ML BUPIVACAINE 1/10 % EPIDURAL INFUSION (WH - ANES)
14.0000 mL/h | INTRAMUSCULAR | Status: DC
  Administered 2012-03-13: 14 mL/h via EPIDURAL
  Filled 2012-03-13: qty 125

## 2012-03-13 MED ORDER — LACTATED RINGERS IV SOLN
INTRAVENOUS | Status: DC
  Administered 2012-03-13 (×3): via INTRAVENOUS

## 2012-03-13 MED ORDER — OXYCODONE-ACETAMINOPHEN 5-325 MG PO TABS: 1.0000 | ORAL_TABLET | ORAL | Status: DC | PRN

## 2012-03-13 MED ORDER — LACTATED RINGERS IV SOLN: 500.0000 mL | INTRAVENOUS | Status: DC | PRN

## 2012-03-13 MED ORDER — PHENYLEPHRINE 40 MCG/ML (10ML) SYRINGE FOR IV PUSH (FOR BLOOD PRESSURE SUPPORT)
80.0000 ug | PREFILLED_SYRINGE | INTRAVENOUS | Status: DC | PRN
  Administered 2012-03-13: 80 ug via INTRAVENOUS
  Filled 2012-03-13 (×2): qty 5

## 2012-03-13 MED ORDER — DIPHENHYDRAMINE HCL 50 MG/ML IJ SOLN: 12.5000 mg | INTRAMUSCULAR | Status: DC | PRN

## 2012-03-13 MED ORDER — LACTATED RINGERS IV SOLN: INTRAVENOUS | Status: DC

## 2012-03-13 MED ORDER — TERBUTALINE SULFATE 1 MG/ML IJ SOLN: 0.2500 mg | Freq: Once | INTRAMUSCULAR | Status: AC | PRN

## 2012-03-13 MED ORDER — LIDOCAINE HCL (PF) 1 % IJ SOLN
INTRAMUSCULAR | Status: DC | PRN
  Administered 2012-03-13 (×2): 5 mL

## 2012-03-13 MED ORDER — LACTATED RINGERS IV SOLN: 500.0000 mL | Freq: Once | INTRAVENOUS | Status: DC

## 2012-03-13 MED ORDER — OXYTOCIN 40 UNITS IN LACTATED RINGERS INFUSION - SIMPLE MED: 62.5000 mL/h | INTRAVENOUS | Status: DC

## 2012-03-13 MED ORDER — BUTORPHANOL TARTRATE 1 MG/ML IJ SOLN
1.0000 mg | Freq: Once | INTRAMUSCULAR | Status: AC
  Administered 2012-03-13: 1 mg via INTRAVENOUS
  Filled 2012-03-13: qty 1

## 2012-03-13 MED ORDER — OXYTOCIN BOLUS FROM INFUSION: 500.0000 mL | INTRAVENOUS | Status: DC

## 2012-03-13 MED ORDER — LACTATED RINGERS IV SOLN
500.0000 mL | INTRAVENOUS | Status: DC | PRN
  Administered 2012-03-13: 500 mL via INTRAVENOUS

## 2012-03-13 MED ORDER — OXYTOCIN 40 UNITS IN LACTATED RINGERS INFUSION - SIMPLE MED
1.0000 m[IU]/min | INTRAVENOUS | Status: DC
  Administered 2012-03-13: 1 m[IU]/min via INTRAVENOUS
  Filled 2012-03-13: qty 1000

## 2012-03-13 MED ORDER — CITRIC ACID-SODIUM CITRATE 334-500 MG/5ML PO SOLN
30.0000 mL | ORAL | Status: DC | PRN
  Administered 2012-03-14: 30 mL via ORAL
  Filled 2012-03-13: qty 15

## 2012-03-13 MED ORDER — ONDANSETRON HCL 4 MG/2ML IJ SOLN: 4.0000 mg | Freq: Four times a day (QID) | INTRAMUSCULAR | Status: DC | PRN

## 2012-03-13 MED ORDER — EPHEDRINE 5 MG/ML INJ
10.0000 mg | INTRAVENOUS | Status: DC | PRN
  Filled 2012-03-13 (×2): qty 4

## 2012-03-13 MED ORDER — ACETAMINOPHEN 325 MG PO TABS: 650.0000 mg | ORAL_TABLET | ORAL | Status: DC | PRN

## 2012-03-13 MED ORDER — FLEET ENEMA 7-19 GM/118ML RE ENEM: 1.0000 | ENEMA | RECTAL | Status: DC | PRN

## 2012-03-13 NOTE — Anesthesia Procedure Notes (Signed)

## 2012-03-13 NOTE — H&P (Signed)
[name], [name]             ACCOUNT NO.:  [id number]  MEDICAL RECORD NO.:  [id number]  LOCATION:  9167                          FACILITY:  [facility]  PHYSICIAN:  Malachi Pro. Ambrose Mantle, M.D. DATE OF BIRTH:  March 16, 1990  DATE OF ADMISSION:  03/13/2012 DATE OF DISCHARGE:                             HISTORY & PHYSICAL   PRESENT ILLNESS:  This is a 22 year old white female, para 0, gravida 1, EDC on March 30, 2012, who is admitted with premature rupture of the membranes.  The patient's blood group and type, O positive.  Negative antibody.  Pap smear ASCUS, colposcopy consistent with that.  Rubella nonimmune, RPR nonreactive, urine culture negative.  Hepatitis B surface antigen negative, HIV negative, GC and Chlamydia negative.  First trimester screen negative.  Cystic fibrosis screen negative.  AFP negative.  One hour Glucola was 73.  Group B strep negative.  This patient entered our practice at 22 weeks.  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  Colposcopy showed acetowhite only.  Advice was to repeat the Pap postpartum.  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  After arrival in our office, she had a relatively benign prenatal course.  No particular abnormalities.  At 3:00 am today, she had spontaneous rupture of membranes.  She came to the hospital at approximately 9:00 a.m. and was found to have ruptured membranes, and she is admitted for Pitocin.  ALLERGIES:  No known drug allergies.  No latex allergies.  PAST MEDICAL HISTORY:  She has a history of an abnormal Pap that has been followed in pregnancy.  She has a history of gonorrhea in 2012.  SURGICAL HISTORY:  Cholecystectomy at age 52.  FAMILY HISTORY:  Maternal grandmother with diabetes.  Paternal grandmother with diabetes and mother with diabetes.  A nephew had Down syndrome.                 OBSTETRIC HISTORY:  There is no obstetric history.  SOCIAL HISTORY:   The patient admits to a prior history of marijuana, tobacco, and prior use of alcohol.  PHYSICAL EXAMINATION:  VITAL SIGNS:  On admission, temperature 97.5, pulse 106, respirations 16, blood pressure 145/92. HEART:  Normal size and sounds.  No murmurs. LUNGS:  Clear to auscultation.    GU:  Fundal height consistent with dates per the admitting nurse.  She is grossly ruptured. At her last prenatal exam,she was 1 cm, 50%,  vertex at a -2.  ADMITTING IMPRESSION:  Intrauterine pregnancy, 37+ weeks, premature rupture of the membranes, borderline high blood pressure.  The patient will be admitted, started on Pitocin, and PIH labs will be done.     Malachi Pro. Ambrose Mantle, M.D.     TFH/MEDQ  D:  03/13/2012  T:  03/13/2012  Job:  [id number]

## 2012-03-13 NOTE — Anesthesia Preprocedure Evaluation (Signed)

## 2012-03-13 NOTE — Progress Notes (Signed)
Patient ID: [name], female   DOB: 09-11-1990, 22 y.o.   MRN: [id number] Pitocin is at 21 mu/ minute and her contractions are q 2 minutes but are not painful. Will continue to try to get her into labor.

## 2012-03-13 NOTE — Progress Notes (Signed)
Patient ID: [name], female   DOB: September 04, 1990, 22 y.o.   MRN: [id number] Pitocin at 27 mu/ minute and [name] contractions are q 2-3 minutes. The contractions are very painful. The cervix is 3 cm 80 % effaced and the vertex is at - 2 station. The PP felt so soft that I wondered if there were still a sac in front of the baby's head but I got no fluid on using the amniohook. I did and [place] to confirm vertex presentation. Will get an epidural to get [name] comfortable

## 2012-03-14 ENCOUNTER — Encounter (HOSPITAL_COMMUNITY): Payer: Self-pay | Admitting: Anesthesiology

## 2012-03-14 ENCOUNTER — Encounter (HOSPITAL_COMMUNITY): Payer: Self-pay | Admitting: *Deleted

## 2012-03-14 ENCOUNTER — Inpatient Hospital Stay (HOSPITAL_COMMUNITY): Payer: Medicaid Other | Admitting: Anesthesiology

## 2012-03-14 ENCOUNTER — Encounter (HOSPITAL_COMMUNITY)
Admission: AD | Disposition: A | Discharge status: Home / Self Care | Payer: Self-pay | Source: Ambulatory Visit | Attending: Obstetrics and Gynecology

## 2012-03-14 SURGERY — Surgical Case
Anesthesia: Epidural | Site: Abdomen | Wound class: Clean Contaminated

## 2012-03-14 MED ORDER — SENNOSIDES-DOCUSATE SODIUM 8.6-50 MG PO TABS
2.0000 | ORAL_TABLET | Freq: Every day | ORAL | Status: DC
  Administered 2012-03-14 – 2012-03-16 (×3): 2 via ORAL

## 2012-03-14 MED ORDER — FENTANYL CITRATE 0.05 MG/ML IJ SOLN: 25.0000 ug | INTRAMUSCULAR | Status: DC | PRN

## 2012-03-14 MED ORDER — DIPHENHYDRAMINE HCL 50 MG/ML IJ SOLN: 12.5000 mg | INTRAMUSCULAR | Status: DC | PRN

## 2012-03-14 MED ORDER — OXYCODONE-ACETAMINOPHEN 5-325 MG PO TABS
1.0000 | ORAL_TABLET | ORAL | Status: DC | PRN
  Administered 2012-03-14 – 2012-03-17 (×11): 1 via ORAL
  Filled 2012-03-14 (×4): qty 1
  Filled 2012-03-14: qty 2
  Filled 2012-03-14 (×6): qty 1

## 2012-03-14 MED ORDER — KETOROLAC TROMETHAMINE 30 MG/ML IJ SOLN: 30.0000 mg | Freq: Four times a day (QID) | INTRAMUSCULAR | Status: AC | PRN

## 2012-03-14 MED ORDER — SCOPOLAMINE 1 MG/3DAYS TD PT72
1.0000 | MEDICATED_PATCH | Freq: Once | TRANSDERMAL | Status: AC
  Administered 2012-03-14: 1.5 mg via TRANSDERMAL

## 2012-03-14 MED ORDER — CEFAZOLIN SODIUM-DEXTROSE 2-3 GM-% IV SOLR
2.0000 g | Freq: Once | INTRAVENOUS | Status: AC
  Administered 2012-03-14: 2 g via INTRAVENOUS
  Filled 2012-03-14: qty 50

## 2012-03-14 MED ORDER — NALBUPHINE HCL 10 MG/ML IJ SOLN
5.0000 mg | INTRAMUSCULAR | Status: DC | PRN
Start: 2012-03-14 — End: 2012-03-17
  Filled 2012-03-14: qty 1

## 2012-03-14 MED ORDER — EPHEDRINE SULFATE 50 MG/ML IJ SOLN
INTRAMUSCULAR | Status: DC | PRN
  Administered 2012-03-14: 10 mg via INTRAVENOUS

## 2012-03-14 MED ORDER — DIBUCAINE 1 % RE OINT: 1.0000 "application " | TOPICAL_OINTMENT | RECTAL | Status: DC | PRN

## 2012-03-14 MED ORDER — DIPHENHYDRAMINE HCL 25 MG PO CAPS: 25.0000 mg | ORAL_CAPSULE | ORAL | Status: DC | PRN

## 2012-03-14 MED ORDER — MIDAZOLAM HCL 2 MG/2ML IJ SOLN: 0.5000 mg | Freq: Once | INTRAMUSCULAR | Status: DC | PRN

## 2012-03-14 MED ORDER — OXYTOCIN 10 UNIT/ML IJ SOLN
INTRAMUSCULAR | Status: AC
  Filled 2012-03-14: qty 4

## 2012-03-14 MED ORDER — PRENATAL MULTIVITAMIN CH
1.0000 | ORAL_TABLET | Freq: Every day | ORAL | Status: AC
  Administered 2012-03-15 – 2012-03-16 (×2): 1 via ORAL
  Filled 2012-03-14 (×4): qty 1

## 2012-03-14 MED ORDER — DIPHENHYDRAMINE HCL 25 MG PO CAPS: 25.0000 mg | ORAL_CAPSULE | Freq: Four times a day (QID) | ORAL | Status: DC | PRN

## 2012-03-14 MED ORDER — LANOLIN HYDROUS EX OINT: 1.0000 "application " | TOPICAL_OINTMENT | CUTANEOUS | Status: DC | PRN

## 2012-03-14 MED ORDER — ONDANSETRON HCL 4 MG/2ML IJ SOLN
INTRAMUSCULAR | Status: DC | PRN
  Administered 2012-03-14: 4 mg via INTRAVENOUS

## 2012-03-14 MED ORDER — KETOROLAC TROMETHAMINE 30 MG/ML IJ SOLN
INTRAMUSCULAR | Status: AC
  Filled 2012-03-14: qty 1

## 2012-03-14 MED ORDER — MENTHOL 3 MG MT LOZG: 1.0000 | LOZENGE | OROMUCOSAL | Status: DC | PRN

## 2012-03-14 MED ORDER — WITCH HAZEL-GLYCERIN EX PADS: 1.0000 "application " | MEDICATED_PAD | CUTANEOUS | Status: DC | PRN

## 2012-03-14 MED ORDER — NALOXONE HCL 0.4 MG/ML IJ SOLN: 0.4000 mg | INTRAMUSCULAR | Status: DC | PRN

## 2012-03-14 MED ORDER — MEPERIDINE HCL 25 MG/ML IJ SOLN: 6.2500 mg | INTRAMUSCULAR | Status: DC | PRN

## 2012-03-14 MED ORDER — LACTATED RINGERS IV SOLN
INTRAVENOUS | Status: AC
  Administered 2012-03-14 (×2): via INTRAVENOUS

## 2012-03-14 MED ORDER — PHENYLEPHRINE 40 MCG/ML (10ML) SYRINGE FOR IV PUSH (FOR BLOOD PRESSURE SUPPORT)
PREFILLED_SYRINGE | INTRAVENOUS | Status: AC
  Filled 2012-03-14: qty 5

## 2012-03-14 MED ORDER — SIMETHICONE 80 MG PO CHEW
80.0000 mg | CHEWABLE_TABLET | Freq: Three times a day (TID) | ORAL | Status: DC
  Administered 2012-03-14 – 2012-03-17 (×13): 80 mg via ORAL

## 2012-03-14 MED ORDER — ONDANSETRON HCL 4 MG PO TABS: 4.0000 mg | ORAL_TABLET | ORAL | Status: DC | PRN

## 2012-03-14 MED ORDER — PROMETHAZINE HCL 25 MG/ML IJ SOLN: 6.2500 mg | INTRAMUSCULAR | Status: DC | PRN

## 2012-03-14 MED ORDER — LACTATED RINGERS IV SOLN
INTRAVENOUS | Status: DC | PRN
  Administered 2012-03-14: 02:00:00 via INTRAVENOUS

## 2012-03-14 MED ORDER — SODIUM CHLORIDE 0.9 % IJ SOLN: 3.0000 mL | INTRAMUSCULAR | Status: DC | PRN

## 2012-03-14 MED ORDER — SIMETHICONE 80 MG PO CHEW
80.0000 mg | CHEWABLE_TABLET | ORAL | Status: DC | PRN
Start: 2012-03-14 — End: 2012-03-17

## 2012-03-14 MED ORDER — ONDANSETRON HCL 4 MG/2ML IJ SOLN
INTRAMUSCULAR | Status: AC
  Filled 2012-03-14: qty 2

## 2012-03-14 MED ORDER — INFLUENZA VIRUS VACC SPLIT PF IM SUSP
0.5000 mL | INTRAMUSCULAR | Status: AC
  Administered 2012-03-15: 0.5 mL via INTRAMUSCULAR
  Filled 2012-03-14: qty 0.5

## 2012-03-14 MED ORDER — OXYTOCIN 40 UNITS IN LACTATED RINGERS INFUSION - SIMPLE MED
62.5000 mL/h | INTRAVENOUS | Status: AC
  Administered 2012-03-14: 62.5 mL/h via INTRAVENOUS
  Filled 2012-03-14: qty 1000

## 2012-03-14 MED ORDER — LIDOCAINE-EPINEPHRINE (PF) 2 %-1:200000 IJ SOLN
INTRAMUSCULAR | Status: AC
  Filled 2012-03-14: qty 20

## 2012-03-14 MED ORDER — MORPHINE SULFATE 0.5 MG/ML IJ SOLN
INTRAMUSCULAR | Status: AC
  Filled 2012-03-14: qty 10

## 2012-03-14 MED ORDER — KETOROLAC TROMETHAMINE 30 MG/ML IJ SOLN: 15.0000 mg | Freq: Once | INTRAMUSCULAR | Status: DC | PRN

## 2012-03-14 MED ORDER — ONDANSETRON HCL 4 MG/2ML IJ SOLN
4.0000 mg | Freq: Three times a day (TID) | INTRAMUSCULAR | Status: DC | PRN
  Filled 2012-03-14: qty 2

## 2012-03-14 MED ORDER — ZOLPIDEM TARTRATE 5 MG PO TABS: 5.0000 mg | ORAL_TABLET | Freq: Every evening | ORAL | Status: DC | PRN

## 2012-03-14 MED ORDER — SCOPOLAMINE 1 MG/3DAYS TD PT72
MEDICATED_PATCH | TRANSDERMAL | Status: AC
  Filled 2012-03-14: qty 1

## 2012-03-14 MED ORDER — CEFAZOLIN SODIUM 1-5 GM-% IV SOLN
1.0000 g | Freq: Three times a day (TID) | INTRAVENOUS | Status: AC
  Administered 2012-03-14 (×2): 1 g via INTRAVENOUS
  Filled 2012-03-14 (×2): qty 50

## 2012-03-14 MED ORDER — NALBUPHINE HCL 10 MG/ML IJ SOLN
5.0000 mg | INTRAMUSCULAR | Status: DC | PRN
  Filled 2012-03-14: qty 1

## 2012-03-14 MED ORDER — DIPHENHYDRAMINE HCL 50 MG/ML IJ SOLN: 25.0000 mg | INTRAMUSCULAR | Status: DC | PRN

## 2012-03-14 MED ORDER — KETOROLAC TROMETHAMINE 30 MG/ML IJ SOLN
30.0000 mg | Freq: Four times a day (QID) | INTRAMUSCULAR | Status: AC | PRN
  Administered 2012-03-14: 30 mg via INTRAVENOUS
  Filled 2012-03-14: qty 1

## 2012-03-14 MED ORDER — MEASLES, MUMPS & RUBELLA VAC ~~LOC~~ INJ
0.5000 mL | INJECTION | Freq: Once | SUBCUTANEOUS | Status: AC
  Administered 2012-03-17: 0.5 mL via SUBCUTANEOUS
  Filled 2012-03-14 (×2): qty 0.5

## 2012-03-14 MED ORDER — CEFAZOLIN SODIUM-DEXTROSE 2-3 GM-% IV SOLR
INTRAVENOUS | Status: DC | PRN
  Administered 2012-03-14: 2 g via INTRAVENOUS

## 2012-03-14 MED ORDER — OXYTOCIN 10 UNIT/ML IJ SOLN
40.0000 [IU] | INTRAVENOUS | Status: DC | PRN
  Administered 2012-03-14: 40 [IU] via INTRAVENOUS

## 2012-03-14 MED ORDER — ACETAMINOPHEN 10 MG/ML IV SOLN
1000.0000 mg | Freq: Four times a day (QID) | INTRAVENOUS | Status: AC | PRN
  Filled 2012-03-14: qty 100

## 2012-03-14 MED ORDER — SODIUM BICARBONATE 8.4 % IV SOLN
INTRAVENOUS | Status: AC
  Filled 2012-03-14: qty 50

## 2012-03-14 MED ORDER — NALOXONE HCL 1 MG/ML IJ SOLN
1.0000 ug/kg/h | INTRAVENOUS | Status: DC | PRN
  Filled 2012-03-14: qty 2

## 2012-03-14 MED ORDER — TETANUS-DIPHTH-ACELL PERTUSSIS 5-2.5-18.5 LF-MCG/0.5 IM SUSP
0.5000 mL | Freq: Once | INTRAMUSCULAR | Status: AC
  Administered 2012-03-14: 0.5 mL via INTRAMUSCULAR
  Filled 2012-03-14: qty 0.5

## 2012-03-14 MED ORDER — PHENYLEPHRINE HCL 10 MG/ML IJ SOLN
INTRAMUSCULAR | Status: DC | PRN
  Administered 2012-03-14 (×3): 80 ug via INTRAVENOUS
  Administered 2012-03-14 (×2): 40 ug via INTRAVENOUS
  Administered 2012-03-14: 80 ug via INTRAVENOUS

## 2012-03-14 MED ORDER — ONDANSETRON HCL 4 MG/2ML IJ SOLN
4.0000 mg | INTRAMUSCULAR | Status: DC | PRN
  Administered 2012-03-14: 4 mg via INTRAVENOUS

## 2012-03-14 MED ORDER — SODIUM BICARBONATE 8.4 % IV SOLN
INTRAVENOUS | Status: DC | PRN
  Administered 2012-03-14: 5 mL via EPIDURAL

## 2012-03-14 MED ORDER — MORPHINE SULFATE (PF) 0.5 MG/ML IJ SOLN
INTRAMUSCULAR | Status: DC | PRN
  Administered 2012-03-14: 4 mg via EPIDURAL

## 2012-03-14 MED ORDER — METOCLOPRAMIDE HCL 5 MG/ML IJ SOLN
10.0000 mg | Freq: Three times a day (TID) | INTRAMUSCULAR | Status: DC | PRN
  Administered 2012-03-14: 10 mg via INTRAVENOUS
  Filled 2012-03-14: qty 2

## 2012-03-14 MED ORDER — IBUPROFEN 600 MG PO TABS
600.0000 mg | ORAL_TABLET | Freq: Four times a day (QID) | ORAL | Status: DC
  Administered 2012-03-14 – 2012-03-17 (×12): 600 mg via ORAL
  Filled 2012-03-14 (×12): qty 1

## 2012-03-14 SURGICAL SUPPLY — 42 items
CLOTH BEACON ORANGE TIMEOUT ST (SAFETY) ×2 IMPLANT
CONTAINER PREFILL 10% NBF 15ML (MISCELLANEOUS) IMPLANT
DRAPE LG THREE QUARTER DISP (DRAPES) ×2 IMPLANT
DRESSING TELFA 8X3 (GAUZE/BANDAGES/DRESSINGS) IMPLANT
DRSG OPSITE POSTOP 4X10 (GAUZE/BANDAGES/DRESSINGS) ×2 IMPLANT
DRSG VASELINE 3X18 (GAUZE/BANDAGES/DRESSINGS) ×2 IMPLANT
DURAPREP 26ML APPLICATOR (WOUND CARE) ×2 IMPLANT
ELECT REM PT RETURN 9FT ADLT (ELECTROSURGICAL) ×2
ELECTRODE REM PT RTRN 9FT ADLT (ELECTROSURGICAL) ×1 IMPLANT
EXTRACTOR VACUUM KIWI (MISCELLANEOUS) IMPLANT
EXTRACTOR VACUUM M CUP 4 TUBE (SUCTIONS) IMPLANT
GAUZE SPONGE 4X4 12PLY STRL LF (GAUZE/BANDAGES/DRESSINGS) ×4 IMPLANT
GAUZE VASELINE 3X9 (GAUZE/BANDAGES/DRESSINGS) ×1 IMPLANT
GLOVE BIO SURGEON STRL SZ7.5 (GLOVE) ×4 IMPLANT
GLOVE BIOGEL PI IND STRL 7.5 (GLOVE) IMPLANT
GLOVE BIOGEL PI INDICATOR 7.5 (GLOVE) ×2
GLOVE SURG SS PI 7.0 STRL IVOR (GLOVE) ×1 IMPLANT
GLOVE SURG SS PI 7.5 STRL IVOR (GLOVE) ×1 IMPLANT
GOWN PREVENTION PLUS LG XLONG (DISPOSABLE) ×4 IMPLANT
GOWN PREVENTION PLUS XLARGE (GOWN DISPOSABLE) ×3 IMPLANT
KIT ABG SYR 3ML LUER SLIP (SYRINGE) IMPLANT
NDL HYPO 25X5/8 SAFETYGLIDE (NEEDLE) IMPLANT
NEEDLE HYPO 25X5/8 SAFETYGLIDE (NEEDLE) IMPLANT
NS IRRIG 1000ML POUR BTL (IV SOLUTION) ×2 IMPLANT
PACK C SECTION WH (CUSTOM PROCEDURE TRAY) ×2 IMPLANT
PAD ABD 7.5X8 STRL (GAUZE/BANDAGES/DRESSINGS) IMPLANT
PAD OB MATERNITY 4.3X12.25 (PERSONAL CARE ITEMS) ×2 IMPLANT
RTRCTR C-SECT PINK 25CM LRG (MISCELLANEOUS) ×1 IMPLANT
SLEEVE SCD COMPRESS KNEE MED (MISCELLANEOUS) IMPLANT
STAPLER VISISTAT 35W (STAPLE) IMPLANT
SUT PLAIN 0 NONE (SUTURE) IMPLANT
SUT VIC AB 0 CT1 36 (SUTURE) ×16 IMPLANT
SUT VIC AB 3-0 CTX 36 (SUTURE) ×2 IMPLANT
SUT VIC AB 3-0 SH 27 (SUTURE) ×2
SUT VIC AB 3-0 SH 27X BRD (SUTURE) IMPLANT
SUT VIC AB 4-0 KS 27 (SUTURE) IMPLANT
SUT VIC AB 4-0 SH 27 (SUTURE) ×2
SUT VIC AB 4-0 SH 27XANBCTRL (SUTURE) IMPLANT
SUT VICRYL 0 TIES 12 18 (SUTURE) IMPLANT
TOWEL OR 17X24 6PK STRL BLUE (TOWEL DISPOSABLE) ×6 IMPLANT
TRAY FOLEY CATH 14FR (SET/KITS/TRAYS/PACK) ×2 IMPLANT
WATER STERILE IRR 1000ML POUR (IV SOLUTION) ×2 IMPLANT

## 2012-03-14 NOTE — Anesthesia Postprocedure Evaluation (Signed)
  Anesthesia Post-op Note  Patient: [name]  Procedure(s) Performed: Procedure(s) (LRB) with comments: CESAREAN SECTION (N/A) - 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, Primary cesarean section of baby  boy  APGAR 9/9  Patient Location: PACU and Mother/Baby  Anesthesia Type:Spinal  Level of Consciousness: awake  Airway and Oxygen Therapy: Patient Spontanous Breathing  Post-op Pain: none  Post-op Assessment: Patient's Cardiovascular Status Stable, Respiratory Function Stable, Patent Airway, No signs of Nausea or vomiting, Adequate PO intake, Pain level controlled, No headache, No backache, No residual numbness and No residual motor weakness  Post-op Vital Signs: Reviewed and stable  Complications: No apparent anesthesia complications

## 2012-03-14 NOTE — Progress Notes (Signed)
Patient ID: [name], female   DOB: 01-17-1991, 22 y.o.   MRN: [id number] The pt received an epidural at around 11:04PM and the pt became somewhat hypotensive and the FHR had some deep decelerations that were corrected slowly by giving phenylephrine. The decelerations gradually cleared but there was some mild fetal tachycardia and reduced variability. I checked her at 11:33 PM and the cervix was 3-4 cm 80-90% effaced and the vertex was at - 1/2 station. Now, the FHR has returned to a normal baseline and the variability has increased and there are intermittent decelerations.Will recheck at 12:30 AM to monitor progress.

## 2012-03-14 NOTE — OR Nursing (Signed)
50 ml blood loss during fundal massage by DLWegner RN

## 2012-03-14 NOTE — Progress Notes (Signed)
Patient ID: [name], female   DOB: 1990-08-04, 22 y.o.   MRN: [id number] DOS afebrile BP normal no complaints

## 2012-03-14 NOTE — Progress Notes (Signed)
Patient ID: [name], female   DOB: 1990-10-28, 22 y.o.   MRN: [id number] The FHR continues to show decelerations and the cervix is unchanged so will proceed with c section. The OR is doing a case so I have asked for a second team I have discontinued the pitocin.

## 2012-03-14 NOTE — Progress Notes (Signed)
UR chart review completed.  

## 2012-03-14 NOTE — Anesthesia Postprocedure Evaluation (Signed)
Anesthesia Post Note  Patient: [name]  Procedure(s) Performed: Procedure(s) (LRB): CESAREAN SECTION (N/A)  Anesthesia type: Epidural  Patient location: PACU  Post pain: Pain level controlled  Post assessment: Post-op Vital signs reviewed  Last Vitals:  Filed Vitals:   03/14/12 0345  BP: 116/70  Pulse: 115  Temp:   Resp: 15    Post vital signs: Reviewed  Level of consciousness: awake  Complications: No apparent anesthesia complications

## 2012-03-14 NOTE — Op Note (Signed)
NAMEKAIYLA, STAHLY             ACCOUNT NO.:  [id number]  MEDICAL RECORD NO.:  [id number]  LOCATION:  [facility]                          FACILITY:  [facility]  PHYSICIAN:  Malachi Pro. Ambrose Mantle, M.D. 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 Ovarian cyst on the left not opened, probably dermoid.  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  OPERATOR:  Malachi Pro. Ambrose Mantle, M.D.  ANESTHESIA:  Epidural, Dr. Brayton Caves.  The patient was brought to the operating room, and after her Pitocin was turned off.  The fetal heart rate became normal.  The scalp electrode was left in place until anesthesia was satisfactory.  A Foley catheter was indwelling.  The abdomen was prepped with a DuraPrep and a time-out was done and then after 3 minutes, the abdomen was draped as a sterile field.  Anesthesia was confirmed.  A transverse incision was made and carried in layers through the skin, subcutaneous tissue, and fascia. Fascia was separated from the rectus muscles superiorly and inferiorly. The rectus muscle split in the midline.  Peritoneum opened vertically an Alexis retractor was used to expose the lower uterine segment.  I made a superficial transverse incision through the top layers of the myometrium, then went into the amniotic sac with my finger pulled superiorly and inferiorly,  reached down in the pelvis elevated the vertex which was directly occiput posterior.  I turned the vertex around somewhat delivered the vertex through the incisional opening without difficulty, delivered the remainder of the body after the nose and pharynx was suctioned with the bulb.  The cord was clamped, and the infant was given to Dr. Dorene Grebe who was in attendance.  He assigned the female infant Apgars of 9 and 9 at 1 and 5 minutes.  There was some brisk bleeders on the cut edge of the myometrium that were clamped with the ring forceps.  The placenta was removed intact the inside of the uterus was inspected and found to be free of any additional products of conception, and the uterine incision was closed with 2 running sutures of 0 Vicryl locking the first layer and nonlocking on the second layer. An extra suture of 0 Vicryl was required for hemostasis prior to the imbricating layer.  I turned my attention then to the uterus which was normal.  The right tube and ovary which were normal.  The left ovary was enlarged with a cyst and the left tube was normal knowing that the ultrasound had been predicted a dermoid cyst, I made a superficial incision through the ovarian serosa, undermined it with Halsted clamps. I dissected out the tumor with blunt and Bovie  dissection and removed the tumor intact.  Some bleeders were cauterized.  Others were controlled with deep sutures of 3-0 Vicryl.  Then the surface of the ovary was reapproximated with a running 4-0 Vicryl suture.  Hemostasis was completely controlled.  Liberal irrigation then confirmed hemostasis on both the ovary and the uterine incision.  The gutters were blotted free of blood.  Liberal irrigation was done again.  The Alexis retractor was removed.  The abdominal wall was closed in layers using interrupted sutures of 0 Vicryl to close the rectus muscle and peritoneum in 1 layer, 2 running sutures of 0 Vicryl on the fascia, running  3-0 Vicryl on the subcu tissue, and staples on the skin.  The patient seemed to tolerate the procedure well and was returned to recovery in satisfactory condition.  Blood loss was probably about a 1000 mL.  Sponge and needle counts were correct.     Malachi Pro. Ambrose Mantle, M.D.     TFH/MEDQ  D:  03/14/2012  T:  03/14/2012  Job:  [id number]

## 2012-03-14 NOTE — Transfer of Care (Signed)
Immediate Anesthesia Transfer of Care Note  Patient: [name]  Procedure(s) Performed: Procedure(s) (LRB) with comments: CESAREAN SECTION (N/A) - 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, Primary cesarean section of baby  boy  APGAR 9/9  Patient Location: PACU  Anesthesia Type:Epidural  Level of Consciousness: awake, alert  and oriented  Airway & Oxygen Therapy: Patient Spontanous Breathing  Post-op Assessment: Report given to PACU RN  Post vital signs: Reviewed and stable  Complications: No apparent anesthesia complications

## 2012-03-14 NOTE — Progress Notes (Signed)
Patient ID: [name], female   DOB: May 23, 1990, 22 y.o.   MRN: [id number] At 12:30 AM the cervical dilatation and effacement and the station of the vertex were unchanged. Will increase the pitocin cautiously

## 2012-03-14 NOTE — Addendum Note (Signed)
Addendum  created 03/14/12 0854 by Suella Grove, CRNA   Modules edited:Charges VN, Notes Section

## 2012-03-15 ENCOUNTER — Encounter (HOSPITAL_COMMUNITY): Payer: Self-pay | Admitting: Obstetrics and Gynecology

## 2012-03-15 LAB — CBC
Hemoglobin: 8.4 g/dL — ABNORMAL LOW (ref 12.0–15.0)
MCHC: 33.5 g/dL (ref 30.0–36.0)
WBC: 10.5 10*3/uL (ref 4.0–10.5)

## 2012-03-15 NOTE — Progress Notes (Signed)
Patient ID: [name], female   DOB: 08/13/1990, 22 y.o.   MRN: [id number] #1 afebrile BP normal States she is tolerating a diet, passing flatus, ambulating and voiding well

## 2012-03-17 MED ORDER — IBUPROFEN 600 MG PO TABS: 600.0000 mg | ORAL_TABLET | Freq: Four times a day (QID) | ORAL | Status: DC | PRN

## 2012-03-17 MED ORDER — OXYCODONE-ACETAMINOPHEN 5-325 MG PO TABS: 1.0000 | ORAL_TABLET | Freq: Four times a day (QID) | ORAL | Status: DC | PRN

## 2012-03-17 NOTE — Discharge Summary (Signed)
[name], FOSCO             ACCOUNT NO.:  [id number]  MEDICAL RECORD NO.:  [id number]  LOCATION:  9105                          FACILITY:  [facility]  PHYSICIAN:  Malachi Pro. Ambrose Mantle, M.D. DATE OF BIRTH:  02/04/1991  DATE OF ADMISSION:  03/13/2012 DATE OF DISCHARGE:  03/17/2012                              DISCHARGE SUMMARY   HISTORY OF PRESENT ILLNESS:  A 22 year old white female, para 0, gravida 1, EDC March 30, 2012, admitted with premature rupture of the membranes.  She was nonimmune to rubella.  Had an ASCUS Pap smear. Colposcopy was unremarkable.  On admission to the hospital, the cervix was 1 cm, 50%.  She was started on Pitocin.  Failed to make good progress in spite of high doses of Pitocin, and the fetal heart rate deteriorated so she was taken to the operating room and underwent a low- transverse cervical C-section by Dr. Ambrose Mantle with removal of what appeared to be a small fibroma from the surface of the left ovary and a dermoid cyst from the ovary itself.  Delivered a female infant.  Apgars of 9 and 9 at one and five minutes.  Postpartum, the patient did well and was discharged on the third postpartum day.  LABORATORY DATA:  Shows the path report is pending.  Initial hemoglobin was 11.5, hematocrit 33.0, white count 8800, platelet count 185,000. Followup hemoglobin 8.4, hematocrit 25.1.  FINAL DIAGNOSES:  Intrauterine pregnancy, 37+ weeks with premature rupture of the membranes.  Failure to progress in labor.  Nonreassuring fetal heart rate pattern.  Probable dermoid cyst to the left ovary.  OPERATION:  Low-transverse cervical C-section.  Removal of surface fibroma and dermoid cyst of the left ovary, but pathology report is pending.  FINAL CONDITION:  Improved.  DISCHARGE INSTRUCTIONS:  Instructions include our regular discharge instruction booklet as well as the after visit summary.  Prescriptions for Percocet 5/325, 30 tablets, 1 every 6 hours as needed for pain  and Motrin 600 mg 30 tablets, 1 every 6 hours as needed for pain.  She was also told to take her prenatal vitamins daily and add ferrous sulfate 325 mg 1 daily.  She is to return in 1 week for staple removal.     Malachi Pro. Ambrose Mantle, M.D.     TFH/MEDQ  D:  03/17/2012  T:  03/17/2012  Job:  [id number]

## 2012-03-17 NOTE — Progress Notes (Signed)
Patient ID: [name], female   DOB: March 21, 1990, 22 y.o.   MRN: [id number] #3 afebrile no complaints for d/c.

## 2013-12-11 ENCOUNTER — Encounter (HOSPITAL_COMMUNITY): Payer: Self-pay | Admitting: Obstetrics and Gynecology

## 2014-01-15 ENCOUNTER — Emergency Department (HOSPITAL_COMMUNITY)
Admission: EM | Admit: 2014-01-15 | Discharge: 2014-01-15 | Disposition: A | Discharge status: Home / Self Care | Payer: Medicaid Other | Attending: Emergency Medicine | Admitting: Emergency Medicine

## 2014-01-15 ENCOUNTER — Other Ambulatory Visit: Payer: Self-pay

## 2014-01-15 ENCOUNTER — Encounter (HOSPITAL_COMMUNITY): Payer: Self-pay | Admitting: Emergency Medicine

## 2014-01-15 DIAGNOSIS — R112 Nausea with vomiting, unspecified: Secondary | ICD-10-CM | POA: Insufficient documentation

## 2014-01-15 DIAGNOSIS — Z9049 Acquired absence of other specified parts of digestive tract: Secondary | ICD-10-CM | POA: Insufficient documentation

## 2014-01-15 DIAGNOSIS — Z9889 Other specified postprocedural states: Secondary | ICD-10-CM | POA: Insufficient documentation

## 2014-01-15 DIAGNOSIS — Z3202 Encounter for pregnancy test, result negative: Secondary | ICD-10-CM | POA: Insufficient documentation

## 2014-01-15 DIAGNOSIS — R1013 Epigastric pain: Secondary | ICD-10-CM | POA: Insufficient documentation

## 2014-01-15 DIAGNOSIS — Z87891 Personal history of nicotine dependence: Secondary | ICD-10-CM | POA: Insufficient documentation

## 2014-01-15 DIAGNOSIS — R109 Unspecified abdominal pain: Secondary | ICD-10-CM

## 2014-01-15 LAB — CBC WITH DIFFERENTIAL/PLATELET
Basophils Absolute: 0 10*3/uL (ref 0.0–0.1)
Basophils Relative: 0 % (ref 0–1)
EOS ABS: 0.3 10*3/uL (ref 0.0–0.7)
Eosinophils Relative: 3 % (ref 0–5)
HCT: 41.4 % (ref 36.0–46.0)
Hemoglobin: 13.7 g/dL (ref 12.0–15.0)
LYMPHS ABS: 2.2 10*3/uL (ref 0.7–4.0)
Lymphocytes Relative: 25 % (ref 12–46)
MCH: 28.5 pg (ref 26.0–34.0)
MCHC: 33.1 g/dL (ref 30.0–36.0)
MCV: 86.1 fL (ref 78.0–100.0)
Monocytes Absolute: 0.4 10*3/uL (ref 0.1–1.0)
Monocytes Relative: 4 % (ref 3–12)
NEUTROS PCT: 68 % (ref 43–77)
Neutro Abs: 5.8 10*3/uL (ref 1.7–7.7)
PLATELETS: 181 10*3/uL (ref 150–400)
RBC: 4.81 MIL/uL (ref 3.87–5.11)
RDW: 13 % (ref 11.5–15.5)
WBC: 8.7 10*3/uL (ref 4.0–10.5)

## 2014-01-15 LAB — URINALYSIS, ROUTINE W REFLEX MICROSCOPIC
Bilirubin Urine: NEGATIVE
GLUCOSE, UA: NEGATIVE mg/dL
HGB URINE DIPSTICK: NEGATIVE
Ketones, ur: NEGATIVE mg/dL
Leukocytes, UA: NEGATIVE
Nitrite: NEGATIVE
PH: 7.5 (ref 5.0–8.0)
Protein, ur: NEGATIVE mg/dL
SPECIFIC GRAVITY, URINE: 1.019 (ref 1.005–1.030)
Urobilinogen, UA: 1 mg/dL (ref 0.0–1.0)

## 2014-01-15 LAB — COMPREHENSIVE METABOLIC PANEL
ALBUMIN: 4.3 g/dL (ref 3.5–5.2)
ALK PHOS: 94 U/L (ref 39–117)
ALT: 33 U/L (ref 0–35)
AST: 28 U/L (ref 0–37)
Anion gap: 16 — ABNORMAL HIGH (ref 5–15)
BUN: 12 mg/dL (ref 6–23)
CO2: 18 mEq/L — ABNORMAL LOW (ref 19–32)
Calcium: 9.8 mg/dL (ref 8.4–10.5)
Chloride: 104 mEq/L (ref 96–112)
Creatinine, Ser: 0.7 mg/dL (ref 0.50–1.10)
GFR calc Af Amer: >90 mL/min (ref >90)
GFR calc non Af Amer: >90 mL/min (ref >90)
GLUCOSE: 101 mg/dL — AB (ref 70–99)
POTASSIUM: 4.6 meq/L (ref 3.7–5.3)
SODIUM: 138 meq/L (ref 137–147)
TOTAL PROTEIN: 7.9 g/dL (ref 6.0–8.3)
Total Bilirubin: 0.8 mg/dL (ref 0.3–1.2)

## 2014-01-15 LAB — I-STAT TROPONIN, ED: TROPONIN I, POC: 0 ng/mL (ref 0.00–0.08)

## 2014-01-15 LAB — LIPASE, BLOOD: Lipase: 32 U/L (ref 11–59)

## 2014-01-15 LAB — POC URINE PREG, ED: Preg Test, Ur: NEGATIVE

## 2014-01-15 MED ORDER — ONDANSETRON 4 MG PO TBDP: 4.0000 mg | ORAL_TABLET | Freq: Three times a day (TID) | ORAL | Status: DC | PRN

## 2014-01-15 MED ORDER — DICYCLOMINE HCL 20 MG PO TABS: 20.0000 mg | ORAL_TABLET | Freq: Two times a day (BID) | ORAL | Status: DC

## 2014-01-15 MED ORDER — GI COCKTAIL ~~LOC~~
30.0000 mL | Freq: Once | ORAL | Status: AC
  Administered 2014-01-15: 30 mL via ORAL
  Filled 2014-01-15: qty 30

## 2014-01-15 NOTE — ED Provider Notes (Signed)
CSN: [id number]     Arrival date & time 01/15/14  0905 History   First MD Initiated Contact with Patient 01/15/14 [phone]     Chief Complaint  Patient presents with  . Abdominal Pain     (Consider location/radiation/quality/duration/timing/severity/associated sxs/prior Treatment) Patient is a 23 y.o. female presenting with abdominal pain. The history is provided by the patient and medical records.  Abdominal Pain Associated symptoms: nausea and vomiting     This is a 23 year old female with no significant past medical history presenting to the ED for epigastric abdominal pain, nausea, and vomiting, intermittently for the past week.  Pain described as aching associated with burning sensation with occasional radiation into her mid-sternal region. Patient states symptoms do not appear to be related to food intake, but states she does not feel satisfied after she eats. She denies any fever, chills, or sweats.  Prior abdominal surgeries include cholecystectomy, cesarean section, and ovarian tumor removal.  States she felt that symptoms were related to GERD so has been taking multiple OTC antacids (TUMS, pepcid, zantac) without noted improvement.  Patient denies any chest pain, shortness of breath, palpitations, dizziness, or weakness. She has no known cardiac history. She is not a daily smoker.  Vital signs stable on arrival.  Past Medical History  Diagnosis Date  . No pertinent past medical history    Past Surgical History  Procedure Laterality Date  . Cholecystectomy    . No past surgeries    . 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 Down syndrome Other     maternal half-nephew  . Other Neg Hx   . Diabetes Mother    . Diabetes Maternal Grandmother   . Diabetes Paternal Grandmother    History  Substance Use Topics  . Smoking status: Former Smoker -- 0.50 packs/day    Types: Cigarettes  . Smokeless tobacco: Not on file  . Alcohol Use: No   OB History    Gravida Para Term Preterm AB TAB SAB Ectopic Multiple Living   1 1 1       1      Review of Systems  Gastrointestinal: Positive for nausea, vomiting and abdominal pain.  All other systems reviewed and are negative.     Allergies  Review of patient's allergies indicates no known allergies.  Home Medications   Prior to Admission medications   Medication Sig Start Date End Date Taking? Authorizing Provider  etonogestrel (NEXPLANON) 68 MG IMPL implant 68 mg by Subdermal route once. March 2014   Yes Historical Provider, MD  ibuprofen (ADVIL,MOTRIN) 600 MG tablet Take 1 tablet (600 mg total) by mouth every 6 (six) hours as needed for pain. Patient not taking: Reported on 01/15/2014 03/17/12   Melina Schools, MD  oxyCODONE-acetaminophen (PERCOCET/ROXICET) 5-325 MG per tablet Take 1 tablet by mouth every 6 (six) hours as needed (moderate - severe pain). Patient not taking: Reported on 01/15/2014 03/17/12   Melina Schools, MD   BP 144/90 mmHg  Pulse 75  Temp(Src) 97.6 F (36.4 C) (Oral)  Resp 18  SpO2 100%   Physical Exam  Constitutional: She is oriented to person, place, and time. She appears  well-developed and well-nourished. No distress.  HENT:  Head: Normocephalic and atraumatic.  Mouth/Throat: Oropharynx is clear and moist.  Eyes: Conjunctivae and EOM are normal. Pupils are equal, round, and reactive to light.  Neck: Normal range of motion. Neck supple.  Cardiovascular: Normal rate, regular rhythm and normal heart sounds.   Pulmonary/Chest: Effort normal and breath sounds normal. No respiratory distress. She has no wheezes.  Abdominal: Soft. Bowel sounds are normal. There is no tenderness. There is no guarding and no CVA tenderness.   Abdomen soft, non-distended, no focal tenderness or peritoneal signs  Musculoskeletal: Normal range of motion.  Neurological: She is alert and oriented to person, place, and time.  Skin: Skin is warm and dry. She is not diaphoretic.  Psychiatric: She has a normal mood and affect.  Nursing note and vitals reviewed.   ED Course  Procedures (including critical care time) Labs Review Labs Reviewed  COMPREHENSIVE METABOLIC PANEL - Abnormal; Notable for the following:    CO2 18 (*)    Glucose, Bld 101 (*)    Anion gap 16 (*)    All other components within normal limits  CBC WITH DIFFERENTIAL  URINALYSIS, ROUTINE W REFLEX MICROSCOPIC  LIPASE, BLOOD  POC URINE PREG, ED  I-STAT TROPOININ, ED    Imaging Review No results found.   EKG Interpretation None       Date: 01/15/2014  Rate: 63  Rhythm: normal sinus rhythm  QRS Axis: normal  Intervals: normal  ST/T Wave abnormalities: normal  Conduction Disutrbances:none  Narrative Interpretation:   Old EKG Reviewed: unchanged   MDM   Final diagnoses:  Abdominal pain, unspecified abdominal location  Nausea and vomiting, vomiting of unspecified type   23 year old female with epigastric abdominal discomfort with some radiation to the chest over the past week. She notes intermittent nausea and vomiting does not seem to be food related. Patient is status post cholecystectomy.  On exam, patient afebrile and nontoxic in appearance. Abdominal exam benign, no focal tenderness or peritoneal signs.  Her mucous membranes are moist and she does not appear dehydrated.  Will plan for labs, u/a, EKG.  GI cocktail given.  EKG sinus rhythm without ischemic change. Troponin negative. Lab work reassuring. After GI cocktail, patient states some improvement of her symptoms, however now she feels her stomach is "rolling".  Abdominal exam remains overall benign.  I have offered additional meds, patient declined stating she would just prefer to go home.   Given her rather benign abdominal exam and normal work-up, low suspicion for acute/surgical abdominal pathology at this time and feel discharge home is appropriate.  Rx bentyl and zofran.  Encouraged FU with PCP.  Discussed plan with patient, he/she acknowledged understanding and agreed with plan of care.  Return precautions given for new or worsening symptoms.  Larene Pickett, PA-C 01/15/14 [place], MD 01/15/14 [phone]

## 2014-01-15 NOTE — ED Notes (Signed)
MD at bedside. EDPA PRESENT TO EVALUATE THIS PT 

## 2014-01-15 NOTE — ED Notes (Signed)
Per pt, states upper abdominal pain, discomfort for about a week-poor appetite, although doesn't get satisfied after she eats-nausea, vomiting for past couple of days

## 2014-01-15 NOTE — Discharge Instructions (Signed)
Take the prescribed medication as directed. Follow-up with [facility] for re-check if symptoms persist. Return to the ED for new or worsening symptoms.

## 2014-08-22 ENCOUNTER — Encounter (HOSPITAL_COMMUNITY): Payer: Self-pay | Admitting: Emergency Medicine

## 2014-08-22 ENCOUNTER — Emergency Department (HOSPITAL_COMMUNITY)
Admission: EM | Admit: 2014-08-22 | Discharge: 2014-08-22 | Disposition: A | Discharge status: Home / Self Care | Payer: Medicaid Other | Attending: Emergency Medicine | Admitting: Emergency Medicine

## 2014-08-22 DIAGNOSIS — L0501 Pilonidal cyst with abscess: Secondary | ICD-10-CM | POA: Insufficient documentation

## 2014-08-22 DIAGNOSIS — Z792 Long term (current) use of antibiotics: Secondary | ICD-10-CM | POA: Insufficient documentation

## 2014-08-22 DIAGNOSIS — Z87891 Personal history of nicotine dependence: Secondary | ICD-10-CM | POA: Insufficient documentation

## 2014-08-22 MED ORDER — OXYCODONE-ACETAMINOPHEN 5-325 MG PO TABS: 1.0000 | ORAL_TABLET | ORAL | Status: DC | PRN

## 2014-08-22 MED ORDER — LIDOCAINE-EPINEPHRINE (PF) 2 %-1:200000 IJ SOLN
20.0000 mL | Freq: Once | INTRAMUSCULAR | Status: AC
  Administered 2014-08-22: 20 mL
  Filled 2014-08-22: qty 20

## 2014-08-22 NOTE — ED Provider Notes (Signed)
CSN: [id number]     Arrival date & time 08/22/14  0208 History   First MD Initiated Contact with Patient 08/22/14 0431     Chief Complaint  Patient presents with  . 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 She states they usually go away on their own. No drainage, fever, vomiting.  The history is provided by the patient. No language interpreter was used.    Past Medical History  Diagnosis Date  . No pertinent past medical history    Past Surgical History  Procedure Laterality Date  . Cholecystectomy    . No past surgeries    . 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 Down syndrome Other     maternal half-nephew  . Other Neg Hx   . Diabetes Mother   . Diabetes Maternal Grandmother   . Diabetes Paternal Grandmother    History  Substance Use Topics  . Smoking status: Former Smoker -- 0.50 packs/day    Types: Cigarettes  . Smokeless tobacco: Not on file  . Alcohol Use: No   OB History    Gravida Para Term Preterm AB TAB SAB Ectopic Multiple Living   1 1 1       1      Review of Systems  Constitutional: Negative for fever and chills.  Gastrointestinal: Negative.  Negative for nausea and vomiting.  Musculoskeletal: Negative.   Skin:       C/O Abscess.  Neurological: Negative.       Allergies  Review of patient's allergies indicates no known allergies.  Home Medications   Prior to Admission medications   Medication Sig Start Date End Date Taking? Authorizing Provider  dicyclomine (BENTYL) 20 MG tablet Take 1 tablet (20 mg total)  by mouth 2 (two) times daily. 01/15/14   Larene Pickett, PA-C  etonogestrel (NEXPLANON) 68 MG IMPL implant 68 mg by Subdermal route once. March 2014    Historical Provider, MD  ibuprofen (ADVIL,MOTRIN) 600 MG tablet Take 1 tablet (600 mg total) by mouth every 6 (six) hours as needed for pain. Patient not taking: Reported on 01/15/2014 03/17/12   Newton Pigg, MD  ondansetron (ZOFRAN ODT) 4 MG disintegrating tablet Take 1 tablet (4 mg total) by mouth every 8 (eight) hours as needed for nausea. 01/15/14   Larene Pickett, PA-C  oxyCODONE-acetaminophen (PERCOCET/ROXICET) 5-325 MG per tablet Take 1 tablet by mouth every 6 (six) hours as needed (moderate - severe pain). Patient not taking: Reported on 01/15/2014 03/17/12   Newton Pigg, MD   BP 114/67 mmHg  Pulse 87  Temp(Src) 97.8 F (36.6 C) (Oral)  Resp 16  SpO2 100%  LMP 08/03/2014 Physical Exam  Constitutional: She is oriented to person, place, and time. She appears well-developed and well-nourished.  HENT:  Head: Normocephalic.  Neck: Normal range of motion. Neck supple.  Cardiovascular: Normal rate and regular rhythm.   Pulmonary/Chest: Effort normal and breath sounds normal.  Abdominal: Soft. Bowel sounds are normal. There is no tenderness. There is no rebound and no guarding.  Musculoskeletal: Normal range of motion.  Neurological: She is alert and oriented to person, place, and time.  Skin: Skin is warm and dry. No rash noted.  Tender, erythematous swelling in pilonidal area with fluctuant center c/w abscess.  Psychiatric: She has a normal mood and affect.    ED Course  Procedures (including critical care time) Labs Review Labs Reviewed - No data to display  Imaging Review No results found.   EKG Interpretation None     INCISION AND DRAINAGE Performed by: Charlann Lange A Consent: Verbal consent obtained. Risks and benefits: risks, benefits and alternatives were discussed Type: abscess  Body area:  pilonidal  Anesthesia: local infiltration  Incision was made with a scalpel.  Local anesthetic: lidocaine 2% w/ epinephrine  Anesthetic total: 2 ml  Complexity: complex Blunt dissection to break up loculations  Drainage: purulent  Drainage amount: moderate  Packing material: none  Patient tolerance: Patient tolerated the procedure well with no immediate complications.    MDM   Final diagnoses:  None    1. Pilonidal abscess  Uncomplicated, easily drained abscess. Recommend 2 day recheck. Return precautions provided.    Charlann Lange, PA-C 08/22/14 [place], DO 08/22/14 [phone]

## 2014-08-22 NOTE — ED Notes (Signed)
Fixed dressing applied to I & D site

## 2014-08-22 NOTE — ED Notes (Signed)
Pt states she has an abcess at the top of her buttocks. Alert and oriented.

## 2014-08-22 NOTE — Discharge Instructions (Signed)
Pilonidal Cyst, Care After A pilonidal cyst occurs when hairs get trapped (ingrown) beneath the skin in the crease between the buttocks over your sacrum (the bone under that crease). Pilonidal cysts are most common in young men with a lot of body hair. When the cyst breaks(ruptured) or leaks, fluid from the cyst may cause burning and itching. If the cyst becomes infected, it causes a painful swelling filled with pus (abscess). The pus and trapped hairs need to be removed (often by lancing) so that the infection can heal. The word pilonidal means hair nest. HOME CARE INSTRUCTIONS If the pilonidal sinus was NOT DRAINING OR LANCED:  Keep the area clean and dry. Bathe or shower daily. Wash the area well with a germ-killing soap. Hot tub baths may help prevent infection. Dry the area well with a towel.  Avoid tight clothing in order to keep area as moisture-free as possible.  Keep area between buttocks as free from hair as possible. A depilatory may be used.  Take antibiotics as directed.  Only take over-the-counter or prescription medicines for pain, discomfort, or fever as directed by your caregiver. If the cyst WAS INFECTED AND NEEDED TO BE DRAINED:  Your caregiver may have packed the wound with gauze to keep the wound open. This allows the wound to heal from the inside outward and continue to drain.  Return as directed for a wound check.  If you take tub baths or showers, repack the wound with gauze as directed following. Sponge baths are a good alternative. Sitz baths may be used three to four times a day or as directed.  If an antibiotic was ordered to fight the infection, take as directed.  Only take over-the-counter or prescription medicines for pain, discomfort, or fever as directed by your caregiver.  If a drain was in place and removed, use sitz baths for 20 minutes 4 times per day. Clean the wound gently with mild unscented soap, pat dry, and then apply a dry dressing as  directed. If you had surgery and IT WAS MARSUPIALIZED (LEFT OPEN):  Your wound was packed with gauze to keep the wound open. This allows the wound to heal from the inside outwards and continue draining. The changing of the dressing regularly also helps keep the wound clean.  Return as directed for a wound check.  If you take tub baths or showers, repack the wound with gauze as directed following. Sponge baths are a good alternative. Sitz baths can also be used. This may be done three to four times a day or as directed.  If an antibiotic was ordered to fight the infection, take as directed.  Only take over-the-counter or prescription medicines for pain, discomfort, or fever as directed by your caregiver.  If you had surgery and the wound was closed you may care for it as directed. This generally includes keeping it dry and clean and dressing it as directed. SEEK MEDICAL CARE IF:   You have increased pain, swelling, redness, drainage, or bleeding from the area.  You have a fever.  You have muscles aches, dizziness, or a general ill feeling. Document Released: 02/26/2006 Document Revised: 09/28/2012 Document Reviewed: 05/13/2006 [facility] Patient Information 2015 [place], [place]. This information is not intended to replace advice given to you by your health care provider. Make sure you discuss any questions you have with your health care provider.

## 2015-02-10 NOTE — L&D Delivery Note (Signed)
Delivery Note At 6:35 PM a viable female was delivered via Vaginal, Spontaneous Delivery (Presentation: ;vtx, ROA).  APGAR: 9, 9; weight pending.   Placenta status: spontaneous, intact.  Cord:  with the following complications: none.  Anesthesia:  Epidural Episiotomy: None Lacerations: Labial;Vaginal Suture Repair: 3.0 vicryl rapide Est. Blood Loss (mL): 300  Mom to postpartum.  Baby to Couplet care / Skin to Skin.  Sher Shampine D 09/05/2015, 7:01 PM

## 2015-02-13 DIAGNOSIS — Z3490 Encounter for supervision of normal pregnancy, unspecified, unspecified trimester: Secondary | ICD-10-CM | POA: Insufficient documentation

## 2015-02-13 LAB — OB RESULTS CONSOLE RUBELLA ANTIBODY, IGM: Rubella: IMMUNE

## 2015-02-13 LAB — OB RESULTS CONSOLE RPR: RPR: NONREACTIVE

## 2015-02-13 LAB — OB RESULTS CONSOLE GC/CHLAMYDIA
CHLAMYDIA, DNA PROBE: NEGATIVE
GC PROBE AMP, GENITAL: NEGATIVE

## 2015-02-13 LAB — OB RESULTS CONSOLE HEPATITIS B SURFACE ANTIGEN: Hepatitis B Surface Ag: NEGATIVE

## 2015-02-13 LAB — OB RESULTS CONSOLE HIV ANTIBODY (ROUTINE TESTING): HIV: NONREACTIVE

## 2015-03-13 ENCOUNTER — Ambulatory Visit (INDEPENDENT_AMBULATORY_CARE_PROVIDER_SITE_OTHER): Payer: 59 | Admitting: Family Medicine

## 2015-03-13 VITALS — BP 98/60 | HR 56 | Temp 98.2°F | Resp 16 | Ht 69.0 in | Wt 185.0 lb

## 2015-03-13 DIAGNOSIS — L0501 Pilonidal cyst with abscess: Secondary | ICD-10-CM

## 2015-03-13 MED ORDER — CEPHALEXIN 500 MG PO CAPS: 500.0000 mg | ORAL_CAPSULE | Freq: Four times a day (QID) | ORAL | Status: DC

## 2015-03-13 NOTE — Progress Notes (Signed)
   Subjective:    Patient ID: [name], female    DOB: 1990-04-16, 25 y.o.   MRN: [id number] By signing my name below, I, Zola Button, attest that this documentation has been prepared under the direction and in the presence of Robyn Haber, MD.  Electronically Signed: Zola Button, Medical Scribe. 03/13/2015. 10:51 AM.  HPI HPI Comments: [name] is a 25 y.o. female with a history of recurrent abscesses who presents to the [facility] complaining of a gradual onset abcess to her left buttocks area. Patient is currently about [redacted] weeks pregnant; this is her 2nd pregnancy. Her OB/GYN is at [facility].   Review of Systems  Skin:       Abscess       Objective:   Physical Exam CONSTITUTIONAL: Well developed/well nourished HEAD: Normocephalic/atraumatic EYES: EOM/PERRL ENMT: Mucous membranes moist NECK: supple no meningeal signs SPINE: entire spine nontender CV: S1/S2 noted, no murmurs/rubs/gallops noted LUNGS: Lungs are clear to auscultation bilaterally, no apparent distress ABDOMEN: soft, nontender, no rebound or guarding GU: no cva tenderness NEURO: Pt is awake/alert, moves all extremitiesx4 EXTREMITIES: pulses normal, full ROM SKIN: 1 cm reddened, obvious abscess to the left side of gluteal cleft. PSYCH: no abnormalities of mood noted        Assessment & Plan:   This chart was scribed in my presence and reviewed by me personally.    ICD-9-CM ICD-10-CM   1. Pilonidal abscess 685.0 L05.01 cephALEXin (KEFLEX) 500 MG capsule     Signed, Robyn Haber, MD

## 2015-03-13 NOTE — Patient Instructions (Addendum)
WOUND CARE Please return in 2 days to have your stitches/staples removed or sooner if you have concerns. Marland Kitchen Keep area clean and dry for 24 hours. Do not remove bandage, if applied. . After 24 hours, remove bandage and wash wound gently with mild soap and warm water. Reapply a new bandage after cleaning wound, if directed. . Continue daily cleansing with soap and water until stitches/staples are removed. . Do not apply any ointments or creams to the wound while stitches/staples are in place, as this may cause delayed healing. . Notify the office if you experience any of the following signs of infection: Swelling, redness, pus drainage, streaking, fever >101.0 F . Notify the office if you experience excessive bleeding that does not stop after 15-20 minutes of constant, firm pressure.    Incision and Drainage of a Pilonidal Cyst Incision and drainage is a surgical procedure to open and drain a fluid-filled sac that forms around a hair follicle in the tailbone area between your buttocks (pilonidal cyst). You may need this procedure if the cyst becomes painful, swollen, or infected. There are three types of procedures that may be done. The type of procedure you have depends on the size and severity of your infected cyst. The procedure may be:  Incision and drainage with a special type of bandage (wound packing). Packing is used for wounds that are deep or tunnel under the skin.  Marsupialization. In this procedure, the cyst will be opened and kept open. The edges of the incision will be stitched together to make a pocket.  Incision and drainage without wound packing. LET [facility] CARE PROVIDER KNOW ABOUT:  Any allergies you have.  All medicines you are taking, including vitamins, herbs, eye drops, creams, and over-the-counter medicines.  Previous problems you or members of your family have had with the use of anesthetics.  Any blood disorders you have.  Previous surgeries you  have had.  Medical conditions you have. RISKS AND COMPLICATIONS Generally, this is a safe procedure. However, problems can occur and include:  Infection.  Bleeding.  Having another cyst develop.  Need for more surgery. BEFORE THE PROCEDURE  Ask your health care provider about:  Changing or stopping your regular medicines. This is especially important if you are taking diabetes medicines or blood thinners.  Taking medicines such as aspirin and ibuprofen. These medicines can thin your blood. Do not take these medicines before your procedure if your health care provider tells you not to.  Taking antibiotics before surgery to control the infection.  Do not eat or drink anything for 6-8 hours before the procedure if you are having general anesthesia.  Take a shower the night before the procedure to clean your buttocks area. Take another shower in the morning before surgery.  Plan to have someone take you home after the procedure. PROCEDURE   You will have an IV tube inserted in a vein in your hand or arm.  You will be given one of the following:  A medicine that numbs the area (local anesthetic).  A medicine that makes you go to sleep (general anesthetic).  You also may be given medicine to help you relax during the procedure (sedative).  You will lie face down on the operating table.  Your buttocks area may be shaved.  Tape may be used to spread your buttocks.  Germ-killing solution (antiseptic) may be used to clean the area. Incision and Drainage With Wound Packing:  Your surgeon will make a surgical cut (incision)  over the cyst to open it.  A probe may be used to see if there are tunnels extending away from the cyst under your skin.  Fluid or pus inside the cyst will be drained.  The cyst will be flushed out with a germ-free (sterile) solution.  Packing will be placed into the open cyst. This keeps it open and draining after surgery.  The area will be covered  with a bandage (dressing). Marsupialization:  Your surgeon will make a surgical cut (incision) over the cyst to open it.  A probe may be used to see if there are tunnels extending away from the cyst under your skin.  Fluid or pus inside the cyst will be drained.  The cyst will be flushed out with a germ-free (sterile) solution.  The edges of the incision will be stitched (sutured) to the skin to keep it wide open. The cyst will not be packed.  A rolled-up bandage (dressing) will be taped over the incision. Incision and Drainage Without Packing:  Your surgeon will make a surgical cut (incision) over the cyst to open it.  A probe may be used to see if there are tunnels extending away from the cyst under your skin.  Fluid or pus inside the cyst will be drained.  The cyst will be flushed out with a germ-free (sterile) solution.  Your surgeon may also remove the tissue around the opened cyst.  The incision then will be closed with stitches (sutures). It will not be left open, and packing will not be used.  A bandage (dressing) will be put over the incision area. AFTER THE PROCEDURE  If you had general anesthesia, you will be taken to a recovery area. Your blood pressure, heart rate, breathing rate, and blood oxygen level will be monitored often until the medicines you were given have worn off.  It is normal to have some pain after this procedure. You may be given pain medicine.  Your IV tube can be taken out after you have recovered and your pain is under control.   This information is not intended to replace advice given to you by your health care provider. Make sure you discuss any questions you have with your health care provider.   Document Released: 08/23/2013 Document Reviewed: 08/23/2013 Elsevier Interactive Patient Education [employer].

## 2015-03-13 NOTE — Progress Notes (Signed)
Risk and benefits discussed and verbal consent obtained. Anesthetic allergies reviewed. Patient anesthetized using 1:1 mix of 2% lidocaine with epi. A 1 cm incision was made using a number 11 blade and purulent material was expressed.  The was wound packed. The patient tolerated the procedure without difficulty.   A clean dressing was placed and wound care instructions were provided.  Philis Fendt, MS, PA-C 11:06 AM, 03/13/2015

## 2015-03-15 ENCOUNTER — Encounter: Payer: Self-pay | Admitting: Physician Assistant

## 2015-03-15 ENCOUNTER — Ambulatory Visit (INDEPENDENT_AMBULATORY_CARE_PROVIDER_SITE_OTHER): Payer: 59 | Admitting: Physician Assistant

## 2015-03-15 VITALS — BP 130/62 | HR 90 | Temp 98.2°F | Resp 17 | Ht 69.75 in | Wt 188.0 lb

## 2015-03-15 DIAGNOSIS — Z5189 Encounter for other specified aftercare: Secondary | ICD-10-CM

## 2015-03-15 LAB — WOUND CULTURE
GRAM STAIN: NONE SEEN
Organism ID, Bacteria: NO GROWTH

## 2015-03-15 NOTE — Progress Notes (Signed)
03/15/2015 10:22 AM   DOB: 12-08-1990 / MRN: [id number]  SUBJECTIVE:  Ms. [name] is a well appearing 25 y.o. here today for wound care. She denies exquisite tenderness at the site of the wound, nausea, emesis, fever and chills.  She has been compliant with medical therapy and recommendations thus far.   She has No Known Allergies.   She  has a past medical history of No pertinent past medical history.    She  reports that she has quit smoking. Her smoking use included Cigarettes. She smoked 0.50 packs per day. She does not have any smokeless tobacco history on file. She reports that she does not drink alcohol or use illicit drugs. She  reports that she currently engages in sexual activity. 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  Her family history includes Diabetes in her maternal grandmother, mother, and paternal grandmother; Down syndrome in her other. There is no history of Other.  Review of Systems  Constitutional: Negative for fever and chills.  Eyes: Negative for blurred vision.  Respiratory: Negative for cough and shortness of breath.   Cardiovascular: Negative for chest pain.  Gastrointestinal: Negative for nausea and abdominal pain.  Genitourinary: Negative for dysuria, urgency and frequency.  Musculoskeletal: Negative for myalgias.  Skin: Negative for rash.  Neurological: Negative for dizziness, tingling and headaches.  Psychiatric/Behavioral: Negative for depression. The patient is not nervous/anxious.     Problem list and medications reviewed and updated by myself where necessary, and exist elsewhere in the encounter.   OBJECTIVE:  BP 130/62 mmHg  Pulse 90  Temp(Src) 98.2 F (36.8 C) (Oral)  Resp 17  Ht 5' 9.75" (1.772 m)  Wt 188 lb (85.276 kg)  BMI 27.16 kg/m2  SpO2 99%  LMP 11/29/2014 CrCl cannot be calculated (Patient has no serum creatinine result on  file.).  Physical Exam  Constitutional: She is oriented to person, place, and time. She appears well-nourished. No distress.  Eyes: EOM are normal. Pupils are equal, round, and reactive to light.  Cardiovascular: Normal rate.   Pulmonary/Chest: Effort normal.  Abdominal: She exhibits no distension.  Neurological: She is alert and oriented to person, place, and time. No cranial nerve deficit. Gait normal.  Skin: Skin is dry. She is not diaphoretic.     Psychiatric: She has a normal mood and affect.  Vitals reviewed.   Results for orders placed or performed in visit on 03/13/15 (from the past 48 hour(s))  Wound culture     Status: None   Collection Time: 03/13/15 11:19 AM  Result Value Ref Range   Gram Stain Abundant    Gram Stain WBC present-predominately PMN    Gram Stain No Squamous Epithelial Cells Seen    Gram Stain      Abundant Gram Positive Cocci In Pairs In Chains In Clusters   Organism ID, Bacteria NO GROWTH 2 DAYS     ASSESSMENT AND PLAN  [name] was seen today for dressing change.  Diagnoses and all orders for this visit:  Encounter for wound care: Wound progressing as expected.  Will see her back in 2 days and this will likely be finished.     The patient was advised to call or return to clinic if she does not see an improvement in symptoms or to seek the care of the closest emergency department if she worsens with the above plan.   Philis Fendt, MHS, PA-C [facility] 03/15/2015 10:22 AM

## 2015-03-17 ENCOUNTER — Ambulatory Visit (INDEPENDENT_AMBULATORY_CARE_PROVIDER_SITE_OTHER): Payer: 59 | Admitting: Physician Assistant

## 2015-03-17 VITALS — BP 118/64 | HR 93 | Temp 98.0°F | Resp 16 | Ht 70.0 in | Wt 186.8 lb

## 2015-03-17 DIAGNOSIS — Z5189 Encounter for other specified aftercare: Secondary | ICD-10-CM

## 2015-03-17 NOTE — Progress Notes (Signed)
[facility] [address], [place] [address] [phone]  Date:  03/17/2015   Name:  [name]   DOB:  27-Sep-1990   MRN:  [id number]  PCP:  No primary care provider on file.    Chief Complaint: Wound Check   History of Present Illness:  This is a 25 y.o. female who is presenting for follow up buttock abscess. Underwent I&D 03/13/15. This is her 2nd wound recheck. She is taking keflex as prescribed. Not having any pain, significant drainage. No fever or chills. Husband has been helping her change dressings.  Review of Systems:  Review of Systems See HPI  There are no active problems to display for this patient.   Prior to Admission medications   Medication Sig Start Date End Date Taking? Authorizing Provider  cephALEXin (KEFLEX) 500 MG capsule Take 1 capsule (500 mg total) by mouth 4 (four) times daily. 03/13/15  Yes Robyn Haber, MD  Prenatal Vit-Fe Fumarate-FA (MULTIVITAMIN-PRENATAL) 27-0.8 MG TABS tablet Take 1 tablet by mouth daily at 12 noon.   Yes Historical Provider, MD    No Known Allergies  Past Surgical History  Procedure Laterality Date  . Cholecystectomy    . No past surgeries    . 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 Smoking status: Former Smoker -- 0.50 packs/day    Types: Cigarettes  . Smokeless tobacco: None  . Alcohol Use: No    Family History  Problem Relation Age of Onset  . Down syndrome Other     maternal half-nephew  . Other Neg Hx   . Diabetes Mother   . Diabetes Maternal Grandmother   . Diabetes Paternal Grandmother     Medication list has been reviewed and updated.  Physical Examination:  Physical Exam  Constitutional: She is  oriented to person, place, and time. She appears well-developed and well-nourished. No distress.  HENT:  Head: Normocephalic and atraumatic.  Right Ear: Hearing normal.  Left Ear: Hearing normal.  Nose: Nose normal.  Eyes: Conjunctivae and lids are normal. Right eye exhibits no discharge. Left eye exhibits no discharge. No scleral icterus.  Pulmonary/Chest: Effort normal. No respiratory distress.  Musculoskeletal: Normal range of motion.  Neurological: She is alert and oriented to person, place, and time.  Skin: Skin is warm and dry.  0.5 cm incision in superior gluteal cleft. Almost completely closed. No packing in place No erythema, induration, drainage, tenderness.  Psychiatric: She has a normal mood and affect. Her speech is normal and behavior is normal. Thought content normal.    BP 118/64 mmHg  Pulse 93  Temp(Src) 98 F (36.7 C) (Oral)  Resp 16  Ht 5\' 10"  (1.778 m)  Wt 186 lb 12.8 oz (84.732 kg)  BMI 26.80 kg/m2  SpO2 99%  LMP 11/29/2014  Assessment and Plan:  1. Encounter for wound care Healing well, incision almost completely closed. No need for further f/u. Discussed wound care. Return as needed.   [name], MHS [facility]  03/17/2015

## 2015-08-15 LAB — OB RESULTS CONSOLE GBS: STREP GROUP B AG: NEGATIVE

## 2015-09-05 ENCOUNTER — Inpatient Hospital Stay (HOSPITAL_COMMUNITY): Payer: 59 | Admitting: Anesthesiology

## 2015-09-05 ENCOUNTER — Encounter (HOSPITAL_COMMUNITY): Payer: Self-pay

## 2015-09-05 ENCOUNTER — Inpatient Hospital Stay (HOSPITAL_COMMUNITY)
Admission: AD | Admit: 2015-09-05 | Discharge: 2015-09-07 | DRG: 775 | Disposition: A | Discharge status: Home / Self Care | Payer: 59 | Source: Ambulatory Visit | Attending: Obstetrics and Gynecology | Admitting: Obstetrics and Gynecology

## 2015-09-05 DIAGNOSIS — Z833 Family history of diabetes mellitus: Secondary | ICD-10-CM | POA: Diagnosis not present

## 2015-09-05 DIAGNOSIS — Z3A40 40 weeks gestation of pregnancy: Secondary | ICD-10-CM

## 2015-09-05 DIAGNOSIS — O34219 Maternal care for unspecified type scar from previous cesarean delivery: Secondary | ICD-10-CM | POA: Diagnosis not present

## 2015-09-05 DIAGNOSIS — Z8279 Family history of other congenital malformations, deformations and chromosomal abnormalities: Secondary | ICD-10-CM

## 2015-09-05 DIAGNOSIS — Z87891 Personal history of nicotine dependence: Secondary | ICD-10-CM | POA: Diagnosis not present

## 2015-09-05 DIAGNOSIS — O34211 Maternal care for low transverse scar from previous cesarean delivery: Secondary | ICD-10-CM | POA: Diagnosis present

## 2015-09-05 LAB — CBC
HCT: 29.7 % — ABNORMAL LOW (ref 36.0–46.0)
HEMOGLOBIN: 9.7 g/dL — AB (ref 12.0–15.0)
MCH: 24.7 pg — AB (ref 26.0–34.0)
MCHC: 32.7 g/dL (ref 30.0–36.0)
MCV: 75.8 fL — AB (ref 78.0–100.0)
Platelets: 144 10*3/uL — ABNORMAL LOW (ref 150–400)
RBC: 3.92 MIL/uL (ref 3.87–5.11)
RDW: 15 % (ref 11.5–15.5)
WBC: 11.1 10*3/uL — ABNORMAL HIGH (ref 4.0–10.5)

## 2015-09-05 LAB — TYPE AND SCREEN
ABO/RH(D): O POS
Antibody Screen: NEGATIVE

## 2015-09-05 LAB — RPR: RPR: NONREACTIVE

## 2015-09-05 MED ORDER — PRENATAL MULTIVITAMIN CH
1.0000 | ORAL_TABLET | Freq: Every day | ORAL | Status: DC
  Administered 2015-09-06: 1 via ORAL
  Filled 2015-09-05: qty 1

## 2015-09-05 MED ORDER — SOD CITRATE-CITRIC ACID 500-334 MG/5ML PO SOLN: 30.0000 mL | ORAL | Status: DC | PRN

## 2015-09-05 MED ORDER — COCONUT OIL OIL: 1.0000 "application " | TOPICAL_OIL | Status: DC | PRN

## 2015-09-05 MED ORDER — OXYCODONE-ACETAMINOPHEN 5-325 MG PO TABS: 2.0000 | ORAL_TABLET | ORAL | Status: DC | PRN

## 2015-09-05 MED ORDER — METHYLERGONOVINE MALEATE 0.2 MG/ML IJ SOLN: 0.2000 mg | INTRAMUSCULAR | Status: DC | PRN

## 2015-09-05 MED ORDER — BUTORPHANOL TARTRATE 1 MG/ML IJ SOLN
INTRAMUSCULAR | Status: AC
  Filled 2015-09-05: qty 1

## 2015-09-05 MED ORDER — DIBUCAINE 1 % RE OINT: 1.0000 "application " | TOPICAL_OINTMENT | RECTAL | Status: DC | PRN

## 2015-09-05 MED ORDER — FLEET ENEMA 7-19 GM/118ML RE ENEM: 1.0000 | ENEMA | RECTAL | Status: DC | PRN

## 2015-09-05 MED ORDER — SENNOSIDES-DOCUSATE SODIUM 8.6-50 MG PO TABS
2.0000 | ORAL_TABLET | ORAL | Status: DC
  Administered 2015-09-06 (×2): 2 via ORAL
  Filled 2015-09-05 (×2): qty 2

## 2015-09-05 MED ORDER — LACTATED RINGERS IV SOLN: 500.0000 mL | INTRAVENOUS | Status: DC | PRN

## 2015-09-05 MED ORDER — BUTORPHANOL TARTRATE 1 MG/ML IJ SOLN
1.0000 mg | Freq: Once | INTRAMUSCULAR | Status: AC
  Administered 2015-09-05: 1 mg via INTRAVENOUS

## 2015-09-05 MED ORDER — ONDANSETRON HCL 4 MG PO TABS: 4.0000 mg | ORAL_TABLET | ORAL | Status: DC | PRN

## 2015-09-05 MED ORDER — WITCH HAZEL-GLYCERIN EX PADS: 1.0000 "application " | MEDICATED_PAD | CUTANEOUS | Status: DC | PRN

## 2015-09-05 MED ORDER — OXYTOCIN 40 UNITS IN LACTATED RINGERS INFUSION - SIMPLE MED
1.0000 m[IU]/min | INTRAVENOUS | Status: DC
  Administered 2015-09-05: 1 m[IU]/min via INTRAVENOUS
  Filled 2015-09-05: qty 1000

## 2015-09-05 MED ORDER — ACETAMINOPHEN 325 MG PO TABS: 650.0000 mg | ORAL_TABLET | ORAL | Status: DC | PRN

## 2015-09-05 MED ORDER — ONDANSETRON HCL 4 MG/2ML IJ SOLN: 4.0000 mg | INTRAMUSCULAR | Status: DC | PRN

## 2015-09-05 MED ORDER — TETANUS-DIPHTH-ACELL PERTUSSIS 5-2.5-18.5 LF-MCG/0.5 IM SUSP: 0.5000 mL | Freq: Once | INTRAMUSCULAR | Status: DC

## 2015-09-05 MED ORDER — OXYTOCIN 40 UNITS IN LACTATED RINGERS INFUSION - SIMPLE MED: 2.5000 [IU]/h | INTRAVENOUS | Status: DC

## 2015-09-05 MED ORDER — LIDOCAINE HCL (PF) 1 % IJ SOLN
INTRAMUSCULAR | Status: DC | PRN
  Administered 2015-09-05 (×2): 8 mL via EPIDURAL

## 2015-09-05 MED ORDER — LIDOCAINE HCL (PF) 1 % IJ SOLN
30.0000 mL | INTRAMUSCULAR | Status: DC | PRN
  Filled 2015-09-05: qty 30

## 2015-09-05 MED ORDER — DIPHENHYDRAMINE HCL 50 MG/ML IJ SOLN: 12.5000 mg | INTRAMUSCULAR | Status: DC | PRN

## 2015-09-05 MED ORDER — SIMETHICONE 80 MG PO CHEW: 80.0000 mg | CHEWABLE_TABLET | ORAL | Status: DC | PRN

## 2015-09-05 MED ORDER — OXYCODONE HCL 5 MG PO TABS
5.0000 mg | ORAL_TABLET | ORAL | Status: DC | PRN
  Administered 2015-09-06 – 2015-09-07 (×3): 5 mg via ORAL
  Filled 2015-09-05 (×3): qty 1

## 2015-09-05 MED ORDER — PHENYLEPHRINE 40 MCG/ML (10ML) SYRINGE FOR IV PUSH (FOR BLOOD PRESSURE SUPPORT)
80.0000 ug | PREFILLED_SYRINGE | INTRAVENOUS | Status: DC | PRN
  Filled 2015-09-05: qty 10
  Filled 2015-09-05: qty 5

## 2015-09-05 MED ORDER — EPHEDRINE 5 MG/ML INJ
10.0000 mg | INTRAVENOUS | Status: DC | PRN
  Filled 2015-09-05: qty 4

## 2015-09-05 MED ORDER — OXYCODONE HCL 5 MG PO TABS
10.0000 mg | ORAL_TABLET | ORAL | Status: DC | PRN
Start: 2015-09-05 — End: 2015-09-07
  Administered 2015-09-06: 10 mg via ORAL
  Filled 2015-09-05: qty 2

## 2015-09-05 MED ORDER — ONDANSETRON HCL 4 MG/2ML IJ SOLN: 4.0000 mg | Freq: Four times a day (QID) | INTRAMUSCULAR | Status: DC | PRN

## 2015-09-05 MED ORDER — DIPHENHYDRAMINE HCL 25 MG PO CAPS: 25.0000 mg | ORAL_CAPSULE | Freq: Four times a day (QID) | ORAL | Status: DC | PRN

## 2015-09-05 MED ORDER — FENTANYL 2.5 MCG/ML BUPIVACAINE 1/10 % EPIDURAL INFUSION (WH - ANES)
14.0000 mL/h | INTRAMUSCULAR | Status: DC | PRN
  Administered 2015-09-05: 14 mL/h via EPIDURAL
  Filled 2015-09-05: qty 125

## 2015-09-05 MED ORDER — MAGNESIUM HYDROXIDE 400 MG/5ML PO SUSP: 30.0000 mL | ORAL | Status: DC | PRN

## 2015-09-05 MED ORDER — BUTORPHANOL TARTRATE 1 MG/ML IJ SOLN
1.0000 mg | INTRAMUSCULAR | Status: DC | PRN
  Administered 2015-09-05: 1 mg via INTRAVENOUS
  Filled 2015-09-05: qty 1

## 2015-09-05 MED ORDER — BENZOCAINE-MENTHOL 20-0.5 % EX AERO
1.0000 "application " | INHALATION_SPRAY | CUTANEOUS | Status: DC | PRN
  Administered 2015-09-05: 1 via TOPICAL
  Filled 2015-09-05: qty 56

## 2015-09-05 MED ORDER — LACTATED RINGERS IV SOLN
INTRAVENOUS | Status: DC
  Administered 2015-09-05 (×2): via INTRAVENOUS

## 2015-09-05 MED ORDER — LACTATED RINGERS IV SOLN: 500.0000 mL | Freq: Once | INTRAVENOUS | Status: DC

## 2015-09-05 MED ORDER — OXYTOCIN BOLUS FROM INFUSION: 500.0000 mL | Freq: Once | INTRAVENOUS | Status: DC

## 2015-09-05 MED ORDER — IBUPROFEN 600 MG PO TABS
600.0000 mg | ORAL_TABLET | Freq: Four times a day (QID) | ORAL | Status: DC
  Administered 2015-09-05 – 2015-09-07 (×6): 600 mg via ORAL
  Filled 2015-09-05 (×6): qty 1

## 2015-09-05 MED ORDER — OXYCODONE-ACETAMINOPHEN 5-325 MG PO TABS: 1.0000 | ORAL_TABLET | ORAL | Status: DC | PRN

## 2015-09-05 MED ORDER — METHYLERGONOVINE MALEATE 0.2 MG PO TABS: 0.2000 mg | ORAL_TABLET | ORAL | Status: DC | PRN

## 2015-09-05 MED ORDER — PHENYLEPHRINE 40 MCG/ML (10ML) SYRINGE FOR IV PUSH (FOR BLOOD PRESSURE SUPPORT)
80.0000 ug | PREFILLED_SYRINGE | INTRAVENOUS | Status: DC | PRN
  Filled 2015-09-05: qty 5

## 2015-09-05 MED ORDER — MEASLES, MUMPS & RUBELLA VAC ~~LOC~~ INJ: 0.5000 mL | INJECTION | Freq: Once | SUBCUTANEOUS | Status: DC

## 2015-09-05 MED ORDER — TERBUTALINE SULFATE 1 MG/ML IJ SOLN
0.2500 mg | Freq: Once | INTRAMUSCULAR | Status: DC | PRN
  Filled 2015-09-05: qty 1

## 2015-09-05 MED ORDER — ZOLPIDEM TARTRATE 5 MG PO TABS: 5.0000 mg | ORAL_TABLET | Freq: Every evening | ORAL | Status: DC | PRN

## 2015-09-05 NOTE — Progress Notes (Signed)
Comfortable with epidural Afeb, VSS FHT- 140s, mod variability, early and occasional variable decel, + scalp stim VE-4-5/90/-2, vtx, IUPC Continue pitocin and monitor progress and FHT

## 2015-09-05 NOTE — Progress Notes (Signed)
Report called to Dr. Melba Coon; patient may walk for about an hour and then be rechecked.

## 2015-09-05 NOTE — H&P (Signed)
[name] is a 25 y.o. female, G2P1001, EGA [redacted] weeks with EDC today  presenting for ctx.  On initial eval in MAU had reg ctx, VE 2-3 cm.  She walked and changed to 3 cm, she was 1-2 cm by Dr. Marvel Plan in the office yesterday.  Prenatal care complicated by previous c-section, CTOL and desires VBAC.  OB History    Gravida Para Term Preterm AB Living   2 1 1      0   SAB TAB Ectopic Multiple Live Births           1     Past Medical History:  Diagnosis Date  . No pertinent past medical history    Past Surgical History:  Procedure Laterality Date  . 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 CHOLECYSTECTOMY    . NO PAST SURGERIES    . 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 Social History:  reports that she has quit smoking. Her smoking use included Cigarettes. She smoked 0.50 packs per day. She does not have any smokeless tobacco history on file. She reports that she does not drink alcohol or use drugs.     Maternal Diabetes: No Genetic Screening: Normal Maternal Ultrasounds/Referrals: Normal Fetal Ultrasounds or other Referrals:  None Maternal Substance Abuse:  No Significant Maternal Medications:  None Significant Maternal Lab Results:  Lab values include: Group B Strep negative Other Comments:  VBAC  Review of Systems  Respiratory: Negative.   Cardiovascular: Negative.    Maternal Medical History:  Reason for admission: Contractions.   Contractions: Frequency: regular.   Perceived severity is moderate.    Fetal activity: Perceived fetal activity is normal.    Prenatal complications: no prenatal complications   Dilation: 3 Effacement (%):  60 Station: -2 Exam by:: Dr. Willis Modena Blood pressure 132/89, pulse 78, temperature 98.2 F (36.8 C), temperature source Oral, resp. rate 16, height 5\' 9"  (1.753 m), weight 108.4 kg (239 lb), last menstrual period 11/29/2014, SpO2 99 %, unknown if currently breastfeeding. Maternal Exam:  Uterine Assessment: Contraction strength is moderate.  Contraction frequency is irregular.   Abdomen: Patient reports no abdominal tenderness. Surgical scars: low transverse.   Estimated fetal weight is 7 1/2 lbs.   Fetal presentation: vertex  Introitus: Normal vulva. Normal vagina.  Amniotic fluid character: not assessed.  Pelvis: adequate for delivery.   Cervix: Cervix evaluated by digital exam.     Fetal Exam Fetal Monitor Review: Mode: ultrasound.   Baseline rate: 140.  Variability: moderate (6-25 bpm).   Pattern: accelerations present.    Fetal State Assessment: Category I - tracings are normal. May have had some early decels  Physical Exam  Vitals reviewed. Constitutional: She appears well-developed and well-nourished.  GI: Soft.    Prenatal labs: ABO, Rh:  O pos Antibody:  neg Rubella:  Immune RPR:  NR  HBsAg:   Neg HIV:   NR GBS:  Neg   Assessment/Plan: IUP at 40 weeks in early labor, previous LTCS for VBAC.  Waiting for room on L&D, will AROM once admitted and monitor progress.     [name] D 09/05/2015, 8:45 AM

## 2015-09-05 NOTE — Anesthesia Procedure Notes (Signed)
Epidural Patient location during procedure: OB Start time: 09/05/2015 1:41 PM End time: 09/05/2015 1:57 PM  Staffing Anesthesiologist: Lyn Hollingshead Performed: anesthesiologist   Preanesthetic Checklist Completed: patient identified, surgical consent, pre-op evaluation, timeout performed, IV checked, risks and benefits discussed and monitors and equipment checked  Epidural Patient position: sitting Prep: site prepped and draped and DuraPrep Patient monitoring: continuous pulse ox and blood pressure Approach: midline Location: L3-L4 Injection technique: LOR air  Needle:  Needle type: Tuohy  Needle gauge: 17 G Needle length: 9 cm and 9 Needle insertion depth: 7 cm Catheter type: closed end flexible Catheter size: 19 Gauge Catheter at skin depth: 12 cm Test dose: negative and Other  Assessment Sensory level: T9 Events: blood not aspirated, injection not painful, no injection resistance, negative IV test and no paresthesia  Additional Notes Reason for block:procedure for pain

## 2015-09-05 NOTE — Anesthesia Preprocedure Evaluation (Signed)
Anesthesia Evaluation  Patient identified by MRN, date of birth, ID band Patient awake    Reviewed: Allergy & Precautions, H&P , NPO status , Patient's Chart, lab work & pertinent test results  Airway Mallampati: II  TM Distance: >3 FB Neck ROM: full    Dental no notable dental hx.    Pulmonary neg pulmonary ROS, former smoker,    Pulmonary exam normal breath sounds clear to auscultation       Cardiovascular negative cardio ROS Normal cardiovascular exam     Neuro/Psych negative neurological ROS  negative psych ROS   GI/Hepatic negative GI ROS, Neg liver ROS,   Endo/Other  negative endocrine ROS  Renal/GU negative Renal ROS     Musculoskeletal   Abdominal (+) + obese,   Peds  Hematology negative hematology ROS (+)   Anesthesia Other Findings   Reproductive/Obstetrics (+) Pregnancy                             Anesthesia Physical Anesthesia Plan  ASA: II  Anesthesia Plan: Epidural   Post-op Pain Management:    Induction:   Airway Management Planned:   Additional Equipment:   Intra-op Plan:   Post-operative Plan:   Informed Consent: I have reviewed the patients History and Physical, chart, labs and discussed the procedure including the risks, benefits and alternatives for the proposed anesthesia with the patient or authorized representative who has indicated his/her understanding and acceptance.     Plan Discussed with:   Anesthesia Plan Comments:         Anesthesia Quick Evaluation

## 2015-09-05 NOTE — Progress Notes (Signed)
Ctx are stronger but less often Afeb, VSS FHT- Cat I, no change from last note VE-3/70/-2, vtx, AROM clear Will augment with pitocin and monitor progress

## 2015-09-05 NOTE — MAU Note (Signed)
Pt reports contractions q 5minutes.  

## 2015-09-06 NOTE — Discharge Summary (Addendum)
OB Discharge Summary     Patient Name: [name] DOB: 1990/07/12 MRN: [id number]  Date of admission: 09/05/2015 Delivering MD: Willis Modena, TODD   Date of discharge: 09/07/2015  Admitting diagnosis: 21 WEEKS BLOODY SHOW CTX Intrauterine pregnancy: [redacted]w[redacted]d     Secondary diagnosis:  Active Problems:   Indication for care in labor or delivery   VBAC, delivered, current hospitalization  Additional problems: none     Discharge diagnosis: Term Pregnancy Delivered                                                                                                Post partum procedures: none   Augmentation: AROM  Complications: None  Hospital course:  Onset of Labor With Vaginal Delivery     25 y.o. yo G2P2001 at [redacted]w[redacted]d was admitted in Active Labor on 09/05/2015. Patient had an uncomplicated labor course as follows:  Membrane Rupture Time/Date: 12:33 PM ,09/05/2015   Intrapartum Procedures: Episiotomy: None [1]                                         Lacerations:  Labial [10];Vaginal [6]  Patient had a delivery of a Viable infant. 09/05/2015  Information for the patient's newborn:  Jakyria, Radder [id number]  Delivery Method: Vaginal, Spontaneous Delivery (Filed from Delivery Summary)    Pateint had an uncomplicated postpartum course.  She is ambulating, tolerating a regular diet, passing flatus, and urinating well. Patient is discharged home in stable condition on 09/07/15.    Physical exam  Vitals:   09/05/15 2209 09/06/15 0711 09/06/15 1845 09/07/15 0605  BP: 118/64 110/70 124/81 121/65  Pulse: 100 91 97 72  Resp: 16 18 18 18   Temp: 98.2 F (36.8 C) 98.1 F (36.7 C) 98.2 F (36.8 C) 97.6 F (36.4 C)  TempSrc: Oral Oral Oral Oral  SpO2:      Weight:      Height:       General: alert and cooperative Lochia: appropriate Uterine Fundus: firm Incision: N/A  Labs: Lab Results  Component Value Date   WBC 11.1 (H) 09/05/2015   HGB 9.7 (L) 09/05/2015   HCT 29.7  (L) 09/05/2015   MCV 75.8 (L) 09/05/2015   PLT 144 (L) 09/05/2015   CMP Latest Ref Rng & Units 01/15/2014  Glucose 70 - 99 mg/dL 101(H)  BUN 6 - 23 mg/dL 12  Creatinine 0.50 - 1.10 mg/dL 0.70  Sodium 137 - 147 mEq/L 138  Potassium 3.7 - 5.3 mEq/L 4.6  Chloride 96 - 112 mEq/L 104  CO2 19 - 32 mEq/L 18(L)  Calcium 8.4 - 10.5 mg/dL 9.8  Total Protein 6.0 - 8.3 g/dL 7.9  Total Bilirubin 0.3 - 1.2 mg/dL 0.8  Alkaline Phos 39 - 117 U/L 94  AST 0 - 37 U/L 28  ALT 0 - 35 U/L 33    Discharge instruction: per After Visit Summary and "Baby and Me Booklet".  After visit meds:    Medication List  TAKE these medications   ibuprofen 600 MG tablet Commonly known as:  ADVIL,MOTRIN Take 1 tablet (600 mg total) by mouth every 6 (six) hours.   multivitamin-prenatal 27-0.8 MG Tabs tablet Take 1 tablet by mouth daily at 12 noon.   oxyCODONE 5 MG immediate release tablet Commonly known as:  Oxy IR/ROXICODONE Take 1 tablet (5 mg total) by mouth every 4 (four) hours as needed (pain scale 4-7).       Diet: routine diet  Activity: Advance as tolerated. Pelvic rest for 6 weeks.   Outpatient follow up:6 weeks Follow up Appt:No future appointments. Follow up Visit:No Follow-up on file.  Postpartum contraception: Undecided  Newborn Data: Live born female  Birth Weight: 7 lb 9.2 oz (3436 g) APGAR: 9, 9  Baby Feeding: Breast Disposition:home with mother   09/07/2015 Logan Bores, MD

## 2015-09-06 NOTE — Lactation Note (Signed)
This note was copied from a baby's chart. Lactation Consultation Note: Initial visit with mom. Baby now 43 hours old. Mom reports he has been feeding a lot- reports no pain with latch, Baby last fed 1 hour ago but is showing feeding cues now. Suggested latching baby again and mom agreeable. Baby latched well but no swallows noted. Encouraged breast massage and compressions while he is nursing. Baby nursed for about 7  minutes then going off to sleep. Mom with small breasts- does report some changes in breast the last few Dura Mccormack and nipples were very tender the first few Mayjor Ager. Mom reports she is able to hand express a few drops of Colostrum. Asking about formula. I suggested waiting - frequent nursing will help promote a good milk supply. Did nurse her first baby for 3 months but wasn't able to make enough milk for him and switched to formula. No questions at present. BF brochure given. Reviewed OP appointments and BFSG as resources for support after DC. To call for assist prn  Patient Name: [name] [id number] Date: 09/06/2015 Reason for consult: Initial assessment   Maternal Data Formula Feeding for Exclusion: No Has patient been taught Hand Expression?: Yes (per mom she is able to obtain a few drops) Does the patient have breastfeeding experience prior to this delivery?: Yes  Feeding Feeding Type: Breast Fed Length of feed: 30 min  LATCH Score/Interventions Latch: Grasps breast easily, tongue down, lips flanged, rhythmical sucking.  Audible Swallowing: None Intervention(s): Skin to skin  Type of Nipple: Everted at rest and after stimulation  Comfort (Breast/Nipple): Soft / non-tender     Hold (Positioning): Assistance needed to correctly position infant at breast and maintain latch. Intervention(s): Breastfeeding basics reviewed  LATCH Score: 7  Lactation Tools Discussed/Used     Consult Status Consult Status: Follow-up Date: 09/07/15 Follow-up type:  In-patient    Truddie Crumble 09/06/2015, 11:45 AM

## 2015-09-06 NOTE — Progress Notes (Signed)
Post Partum Day 1 Subjective: no complaints, up ad lib and tolerating PO  Objective: Blood pressure 110/70, pulse 91, temperature 98.1 F (36.7 C), temperature source Oral, resp. rate 18, height 5\' 9"  (1.753 m), weight 108.4 kg (239 lb), last menstrual period 11/29/2014, SpO2 96 %, unknown if currently breastfeeding.  Physical Exam:  General: alert and cooperative Lochia: appropriate Uterine Fundus: firm    Recent Labs  09/05/15 0814  HGB 9.7*  HCT 29.7*    Assessment/Plan: Plan for discharge tomorrow  Doing great, breastfeeding going well   LOS: 1 day   Colten Desroches W 09/06/2015, 9:12 AM

## 2015-09-06 NOTE — Anesthesia Postprocedure Evaluation (Signed)
Anesthesia Post Note  Patient: [name]  Procedure(s) Performed: * No procedures listed *  Patient location during evaluation: Mother Baby Anesthesia Type: Epidural Level of consciousness: awake and alert and oriented Pain management: satisfactory to patient Vital Signs Assessment: post-procedure vital signs reviewed and stable Respiratory status: spontaneous breathing and nonlabored ventilation Cardiovascular status: stable Postop Assessment: no headache, no backache, no signs of nausea or vomiting, adequate PO intake and patient able to bend at knees (patient up walking) Anesthetic complications: no     Last Vitals:  Vitals:   09/05/15 2209 09/06/15 0711  BP: 118/64 110/70  Pulse: 100 91  Resp: 16 18  Temp: 36.8 C 36.7 C    Last Pain:  Vitals:   09/06/15 0711  TempSrc: Oral  PainSc:    Pain Goal: Patients Stated Pain Goal: 2 (09/06/15 [id number])               Willa Rough

## 2015-09-07 MED ORDER — OXYCODONE HCL 5 MG PO TABS: 5.0000 mg | ORAL_TABLET | ORAL | 0 refills | Status: DC | PRN

## 2015-09-07 MED ORDER — OXYCODONE HCL 5 MG PO TABS: 5.0000 mg | ORAL_TABLET | Freq: Four times a day (QID) | ORAL | Status: DC | PRN

## 2015-09-07 MED ORDER — IBUPROFEN 600 MG PO TABS: 600.0000 mg | ORAL_TABLET | Freq: Four times a day (QID) | ORAL | 0 refills | Status: DC

## 2015-09-07 NOTE — Progress Notes (Signed)
Post Partum Day 2 Subjective: no complaints and tolerating PO  Objective: Blood pressure 121/65, pulse 72, temperature 97.6 F (36.4 C), temperature source Oral, resp. rate 18, height 5\' 9"  (1.753 m), weight 108.4 kg (239 lb), last menstrual period 11/29/2014, SpO2 96 %, unknown if currently breastfeeding.  Physical Exam:  General: alert and cooperative Lochia: appropriate Uterine Fundus: firm    Recent Labs  09/05/15 0814  HGB 9.7*  HCT 29.7*    Assessment/Plan: Discharge home     LOS: 2 days   Tayari Yankee W 09/07/2015, 9:34 AM

## 2015-09-07 NOTE — Progress Notes (Addendum)
Clinical Social Work Maternal Date of Service: 09/07/2015 9:49 AM Brittany Nanas, LCSW  CASE MANAGEMENT     Hide copied text  CLINICAL SOCIAL WORK MATERNAL/CHILD NOTE  Patient Details  Name: [name] MRN: [id number] Date of Birth: 09/05/2016  Date:  09/07/2015  Clinical Social Worker Initiating Note:  [name]                    Date/ Time Initiated:  09/07/15/0941                        Child's Name:  [name]   Legal Guardian:  Mother   Need for Interpreter:  None   Date of Referral:  09/06/15     Reason for Referral:  Other (Comment) (MOB had an toddler to die in 04/12/13)   Referral Source:  Central Nursery   Address:  [place] [address]  Phone number:  [phone]   Household Members: Self, Significant Other   Natural Supports (not living in the home): Extended Family, Immediate Family, Friends, Parent, Spouse/significant other   Professional Supports:None   Employment:Full-time   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  MOB was polite, inviting, and receptive to meeting with CSW. MOB introduced her room guest (he was asleep on the couch), as FOB [name].  MOB gave CSW permission to meet with MOB while FOB was in the room. MOB was dressed, and communicated MOB was ready to be discharged.  MOB reported she has everything she needs  for her infant, including a car seat and bassinet.  CSW acknowledged that traumatic death her her infant in 25.  MOB reported that a TV fell on the infant while infant was at his babysitter's home.  MOB expressed initially she was nervous about parenting again, but since delivery MOB is excited about being a mom again especially to a little Brittany.  CSW normalized and validated MOB's thoughts and feelings.   CSW educated MOB about PPD and encouraged MOB to ask question. MOB denied PPD symptoms with her first child. CSW provided MOB of possible supports and interventions to decrease PPD.  CSW also encouraged MOB to seek medical attention if needed for increased signs and symptoms for PPD.  CSW also reviewed safe sleep, and SIDS. MOB and was knowledgeable and asked appropriate questions.   CSW provided MOB with a flyer to various parenting support groups that are offered by the hospital.  CSW Plan/Description: Patient/Family Education , No Further Intervention Required/No Barriers to Discharge, Psychosocial Support and Ongoing Assessment of Needs    Brittany Nanas, LCSW 09/07/2015, 9:49 AM  [name], MSW, LCSW Clinical Social Work [phone]     Admission (Current) on 09/05/2015      Detailed Report

## 2015-09-11 ENCOUNTER — Inpatient Hospital Stay (HOSPITAL_COMMUNITY): Admission: RE | Admit: 2015-09-11 | Payer: Medicaid Other | Source: Ambulatory Visit

## 2016-02-10 NOTE — L&D Delivery Note (Addendum)
Delivery Note Pt began to have deep variables again.  Re-examined and found to be c/c/+2 station.  She was quickly prepped for delivery and pushed great twice.   At 1:30 PM a viable female was delivered via  (Presentation: OA  ).  APGAR: 6, 9; weight pending .   Placenta status:delivered spontaneously .  Cord:  with the following complications: nuchal x 1 reduced .  Cord pH: pending Following delivery, baby initially gave a weak cry but then was a bit floppy.  Moved to the warmer and required blowby O2 for O2 sats in the high 80's.  NICU called to assess and watched baby who improved and able to transition to RA.  Will place skin to skin and follow for any further issues.   Anesthesia:  epidural Episiotomy:  none Lacerations: periurethral abrasion repaired for hemostasis   Suture Repair: 3.0 vicryl rapide Est. Blood Loss (mL):  238mL  Mom to postpartum.  Baby to Couplet care / Skin to Skin.  Logan Bores 12/23/2016, 1:47 PM

## 2016-07-10 LAB — OB RESULTS CONSOLE RPR: RPR: NONREACTIVE

## 2016-07-10 LAB — OB RESULTS CONSOLE ANTIBODY SCREEN: Antibody Screen: NEGATIVE

## 2016-07-10 LAB — OB RESULTS CONSOLE RUBELLA ANTIBODY, IGM: RUBELLA: IMMUNE

## 2016-07-10 LAB — OB RESULTS CONSOLE GC/CHLAMYDIA
Chlamydia: NEGATIVE
Gonorrhea: NEGATIVE

## 2016-07-10 LAB — OB RESULTS CONSOLE HIV ANTIBODY (ROUTINE TESTING): HIV: NONREACTIVE

## 2016-07-10 LAB — OB RESULTS CONSOLE ABO/RH

## 2016-07-10 LAB — OB RESULTS CONSOLE HEPATITIS B SURFACE ANTIGEN: HEP B S AG: NEGATIVE

## 2016-09-24 DIAGNOSIS — O3429 Maternal care due to uterine scar from other previous surgery: Secondary | ICD-10-CM | POA: Insufficient documentation

## 2016-11-23 LAB — OB RESULTS CONSOLE GBS: GBS: NEGATIVE

## 2016-12-22 ENCOUNTER — Telehealth (HOSPITAL_COMMUNITY): Payer: Self-pay | Admitting: *Deleted

## 2016-12-22 ENCOUNTER — Encounter (HOSPITAL_COMMUNITY): Payer: Self-pay | Admitting: *Deleted

## 2016-12-22 NOTE — H&P (Deleted)
  The note originally documented on this encounter has been moved the the encounter in which it belongs.  

## 2016-12-22 NOTE — H&P (Signed)
[name] is a 26 y.o. female G3P2001 at 26 0/7 weeks (EDD 12/23/16 by LMP c/w 15 week [place]) presenting for IOL at term.  Prenatal care complicated by prior h/o LTCS with a successful VBAC.  Her first child died in an accident as a toddler.    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 No pertinent past medical history    Past Surgical History:  Procedure Laterality Date  . CHOLECYSTECTOMY    . NO PAST SURGERIES    . 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 Social History:  reports that she has quit smoking. Her smoking use included cigarettes. She smoked 0.50 packs per day. She quit smokeless tobacco use about 2 years ago. She reports that she does not drink alcohol or use drugs.     Maternal Diabetes: No Genetic Screening: Abnormal:  Results: Elevated risk of Trisomy 21 on tetra screen but Panorama low risk Maternal Ultrasounds/Referrals: Normal Fetal Ultrasounds or other Referrals:  None Maternal Substance Abuse:  No Significant Maternal Medications:  None Significant Maternal Lab Results:  None Other Comments:  None  Review of Systems  Gastrointestinal: Negative for abdominal pain.  Neurological: Negative for headaches.   Maternal Medical History:  Contractions: Frequency: irregular.   Perceived severity is mild.    Fetal activity: Perceived fetal activity is normal.    Prenatal Complications - Diabetes: none.      Last menstrual period 03/18/2016, unknown if currently breastfeeding. Maternal Exam:  Uterine Assessment: Contraction strength is mild.  Contraction frequency is irregular.   Abdomen: Surgical scars: low transverse.    Fetal presentation: vertex  Introitus: Normal vulva. Normal vagina.    Physical Exam  Constitutional: She appears well-developed and well-nourished.  Cardiovascular: Normal rate and regular rhythm.  Respiratory: Effort normal.  GI: Soft.  Genitourinary: Vagina normal.  Musculoskeletal: Normal range of motion.  Neurological: She is alert.  Psychiatric: She has a normal mood and affect.    Prenatal labs: ABO, Rh: O/--/-- (06/01 0000) Antibody: Negative (06/01 0000) Rubella: Immune (06/01 0000) RPR: Nonreactive (06/01 0000)  HBsAg: Negative (06/01 0000)  HIV: Non-reactive (06/01 0000)  GBS: Negative (10/15 0000)  One hour GCT 126 Tetra increased DSR but negative panorama Essential panel negative  Assessment/Plan: Pt admitted for IOL with favorable cervix for VBAC with a prior successful VBAC a year ago.   Plan pitocin and AROM when able.     Logan Bores 12/22/2016, 7:37 PM

## 2016-12-22 NOTE — Telephone Encounter (Signed)
Preadmission screen  

## 2016-12-23 ENCOUNTER — Inpatient Hospital Stay (HOSPITAL_COMMUNITY)
Admission: AD | Admit: 2016-12-23 | Discharge: 2016-12-25 | DRG: 807 | Disposition: A | Discharge status: Home / Self Care | Payer: BLUE CROSS/BLUE SHIELD | Source: Ambulatory Visit | Attending: Obstetrics and Gynecology | Admitting: Obstetrics and Gynecology

## 2016-12-23 ENCOUNTER — Inpatient Hospital Stay (HOSPITAL_COMMUNITY): Payer: BLUE CROSS/BLUE SHIELD | Admitting: Anesthesiology

## 2016-12-23 ENCOUNTER — Inpatient Hospital Stay (HOSPITAL_COMMUNITY)
Admission: RE | Admit: 2016-12-23 | Discharge: 2016-12-23 | Disposition: A | Discharge status: Home / Self Care | Payer: BLUE CROSS/BLUE SHIELD | Source: Ambulatory Visit | Attending: Obstetrics and Gynecology | Admitting: Obstetrics and Gynecology

## 2016-12-23 ENCOUNTER — Encounter (HOSPITAL_COMMUNITY): Payer: Self-pay

## 2016-12-23 DIAGNOSIS — Z3A40 40 weeks gestation of pregnancy: Secondary | ICD-10-CM | POA: Diagnosis not present

## 2016-12-23 DIAGNOSIS — Z87891 Personal history of nicotine dependence: Secondary | ICD-10-CM

## 2016-12-23 DIAGNOSIS — O34219 Maternal care for unspecified type scar from previous cesarean delivery: Principal | ICD-10-CM | POA: Diagnosis present

## 2016-12-23 DIAGNOSIS — O26893 Other specified pregnancy related conditions, third trimester: Secondary | ICD-10-CM | POA: Diagnosis present

## 2016-12-23 LAB — CBC
HEMATOCRIT: 32.8 % — AB (ref 36.0–46.0)
Hemoglobin: 10.3 g/dL — ABNORMAL LOW (ref 12.0–15.0)
MCH: 23.1 pg — AB (ref 26.0–34.0)
MCHC: 31.4 g/dL (ref 30.0–36.0)
MCV: 73.7 fL — AB (ref 78.0–100.0)
Platelets: 181 10*3/uL (ref 150–400)
RBC: 4.45 MIL/uL (ref 3.87–5.11)
RDW: 16.9 % — AB (ref 11.5–15.5)
WBC: 9 10*3/uL (ref 4.0–10.5)

## 2016-12-23 LAB — TYPE AND SCREEN
ABO/RH(D): O POS
ANTIBODY SCREEN: NEGATIVE

## 2016-12-23 LAB — RPR: RPR Ser Ql: NONREACTIVE

## 2016-12-23 MED ORDER — ONDANSETRON HCL 4 MG PO TABS: 4.0000 mg | ORAL_TABLET | ORAL | Status: DC | PRN

## 2016-12-23 MED ORDER — EPHEDRINE 5 MG/ML INJ
10.0000 mg | INTRAVENOUS | Status: DC | PRN
  Filled 2016-12-23: qty 2

## 2016-12-23 MED ORDER — COCONUT OIL OIL: 1.0000 "application " | TOPICAL_OIL | Status: DC | PRN

## 2016-12-23 MED ORDER — SOD CITRATE-CITRIC ACID 500-334 MG/5ML PO SOLN: 30.0000 mL | ORAL | Status: DC | PRN

## 2016-12-23 MED ORDER — OXYTOCIN BOLUS FROM INFUSION
500.0000 mL | Freq: Once | INTRAVENOUS | Status: AC
  Administered 2016-12-23: 500 mL via INTRAVENOUS

## 2016-12-23 MED ORDER — OXYTOCIN 10 UNIT/ML IJ SOLN
INTRAMUSCULAR | Status: AC
  Filled 2016-12-23: qty 4

## 2016-12-23 MED ORDER — SCOPOLAMINE 1 MG/3DAYS TD PT72
MEDICATED_PATCH | TRANSDERMAL | Status: AC
  Filled 2016-12-23: qty 1

## 2016-12-23 MED ORDER — SIMETHICONE 80 MG PO CHEW: 80.0000 mg | CHEWABLE_TABLET | ORAL | Status: DC | PRN

## 2016-12-23 MED ORDER — OXYCODONE HCL 5 MG PO TABS
10.0000 mg | ORAL_TABLET | ORAL | Status: DC | PRN
  Administered 2016-12-24 (×2): 10 mg via ORAL
  Filled 2016-12-23 (×2): qty 2

## 2016-12-23 MED ORDER — LIDOCAINE HCL (PF) 1 % IJ SOLN
INTRAMUSCULAR | Status: DC | PRN
  Administered 2016-12-23 (×2): 4 mL

## 2016-12-23 MED ORDER — LIDOCAINE-EPINEPHRINE (PF) 2 %-1:200000 IJ SOLN
INTRAMUSCULAR | Status: AC
  Filled 2016-12-23: qty 20

## 2016-12-23 MED ORDER — OXYCODONE-ACETAMINOPHEN 5-325 MG PO TABS: 1.0000 | ORAL_TABLET | ORAL | Status: DC | PRN

## 2016-12-23 MED ORDER — MORPHINE SULFATE (PF) 0.5 MG/ML IJ SOLN
INTRAMUSCULAR | Status: AC
  Filled 2016-12-23: qty 10

## 2016-12-23 MED ORDER — TETANUS-DIPHTH-ACELL PERTUSSIS 5-2.5-18.5 LF-MCG/0.5 IM SUSP: 0.5000 mL | Freq: Once | INTRAMUSCULAR | Status: DC

## 2016-12-23 MED ORDER — DIPHENHYDRAMINE HCL 25 MG PO CAPS: 25.0000 mg | ORAL_CAPSULE | Freq: Four times a day (QID) | ORAL | Status: DC | PRN

## 2016-12-23 MED ORDER — DEXAMETHASONE SODIUM PHOSPHATE 4 MG/ML IJ SOLN
INTRAMUSCULAR | Status: AC
  Filled 2016-12-23: qty 1

## 2016-12-23 MED ORDER — ACETAMINOPHEN 325 MG PO TABS
650.0000 mg | ORAL_TABLET | ORAL | Status: DC | PRN
  Administered 2016-12-23 – 2016-12-24 (×3): 650 mg via ORAL
  Filled 2016-12-23 (×3): qty 2

## 2016-12-23 MED ORDER — ONDANSETRON HCL 4 MG/2ML IJ SOLN: 4.0000 mg | INTRAMUSCULAR | Status: DC | PRN

## 2016-12-23 MED ORDER — SENNOSIDES-DOCUSATE SODIUM 8.6-50 MG PO TABS
2.0000 | ORAL_TABLET | ORAL | Status: DC
  Administered 2016-12-23 – 2016-12-25 (×2): 2 via ORAL
  Filled 2016-12-23 (×2): qty 2

## 2016-12-23 MED ORDER — ZOLPIDEM TARTRATE 5 MG PO TABS: 5.0000 mg | ORAL_TABLET | Freq: Every evening | ORAL | Status: DC | PRN

## 2016-12-23 MED ORDER — LACTATED RINGERS IV SOLN
INTRAVENOUS | Status: DC
  Administered 2016-12-23: 08:00:00 via INTRAVENOUS

## 2016-12-23 MED ORDER — LIDOCAINE HCL (PF) 1 % IJ SOLN
30.0000 mL | INTRAMUSCULAR | Status: DC | PRN
  Filled 2016-12-23: qty 30

## 2016-12-23 MED ORDER — DIPHENHYDRAMINE HCL 50 MG/ML IJ SOLN: 12.5000 mg | INTRAMUSCULAR | Status: DC | PRN

## 2016-12-23 MED ORDER — PHENYLEPHRINE 40 MCG/ML (10ML) SYRINGE FOR IV PUSH (FOR BLOOD PRESSURE SUPPORT)
PREFILLED_SYRINGE | INTRAVENOUS | Status: AC
  Filled 2016-12-23: qty 10

## 2016-12-23 MED ORDER — OXYTOCIN 40 UNITS IN LACTATED RINGERS INFUSION - SIMPLE MED
1.0000 m[IU]/min | INTRAVENOUS | Status: DC
  Administered 2016-12-23: 2 m[IU]/min via INTRAVENOUS
  Filled 2016-12-23: qty 1000

## 2016-12-23 MED ORDER — ONDANSETRON HCL 4 MG/2ML IJ SOLN
INTRAMUSCULAR | Status: AC
  Filled 2016-12-23: qty 2

## 2016-12-23 MED ORDER — ONDANSETRON HCL 4 MG/2ML IJ SOLN: 4.0000 mg | Freq: Four times a day (QID) | INTRAMUSCULAR | Status: DC | PRN

## 2016-12-23 MED ORDER — PHENYLEPHRINE 40 MCG/ML (10ML) SYRINGE FOR IV PUSH (FOR BLOOD PRESSURE SUPPORT)
80.0000 ug | PREFILLED_SYRINGE | INTRAVENOUS | Status: DC | PRN
  Filled 2016-12-23: qty 10
  Filled 2016-12-23: qty 5

## 2016-12-23 MED ORDER — BUTORPHANOL TARTRATE 1 MG/ML IJ SOLN: 1.0000 mg | INTRAMUSCULAR | Status: DC | PRN

## 2016-12-23 MED ORDER — TERBUTALINE SULFATE 1 MG/ML IJ SOLN
0.2500 mg | Freq: Once | INTRAMUSCULAR | Status: AC | PRN
  Administered 2016-12-23: 0.25 mg via SUBCUTANEOUS
  Filled 2016-12-23: qty 1

## 2016-12-23 MED ORDER — DEXAMETHASONE SODIUM PHOSPHATE 10 MG/ML IJ SOLN
INTRAMUSCULAR | Status: AC
  Filled 2016-12-23: qty 1

## 2016-12-23 MED ORDER — OXYCODONE HCL 5 MG PO TABS
5.0000 mg | ORAL_TABLET | ORAL | Status: DC | PRN
  Administered 2016-12-23 – 2016-12-25 (×3): 5 mg via ORAL
  Filled 2016-12-23 (×4): qty 1

## 2016-12-23 MED ORDER — LACTATED RINGERS IV SOLN
500.0000 mL | Freq: Once | INTRAVENOUS | Status: AC
  Administered 2016-12-23: 500 mL via INTRAVENOUS

## 2016-12-23 MED ORDER — WITCH HAZEL-GLYCERIN EX PADS
1.0000 "application " | MEDICATED_PAD | CUTANEOUS | Status: DC | PRN
  Administered 2016-12-25: 1 via TOPICAL

## 2016-12-23 MED ORDER — FENTANYL 2.5 MCG/ML BUPIVACAINE 1/10 % EPIDURAL INFUSION (WH - ANES): 14.0000 mL/h | INTRAMUSCULAR | Status: DC | PRN

## 2016-12-23 MED ORDER — OXYCODONE-ACETAMINOPHEN 5-325 MG PO TABS: 2.0000 | ORAL_TABLET | ORAL | Status: DC | PRN

## 2016-12-23 MED ORDER — OXYTOCIN 40 UNITS IN LACTATED RINGERS INFUSION - SIMPLE MED: 2.5000 [IU]/h | INTRAVENOUS | Status: DC

## 2016-12-23 MED ORDER — LACTATED RINGERS IV SOLN: 500.0000 mL | INTRAVENOUS | Status: DC | PRN

## 2016-12-23 MED ORDER — DIBUCAINE 1 % RE OINT
1.0000 "application " | TOPICAL_OINTMENT | RECTAL | Status: DC | PRN
  Administered 2016-12-25: 1 via RECTAL
  Filled 2016-12-23: qty 28

## 2016-12-23 MED ORDER — BENZOCAINE-MENTHOL 20-0.5 % EX AERO: 1.0000 "application " | INHALATION_SPRAY | CUTANEOUS | Status: DC | PRN

## 2016-12-23 MED ORDER — ACETAMINOPHEN 325 MG PO TABS: 650.0000 mg | ORAL_TABLET | ORAL | Status: DC | PRN

## 2016-12-23 MED ORDER — PRENATAL MULTIVITAMIN CH
1.0000 | ORAL_TABLET | Freq: Every day | ORAL | Status: DC
  Administered 2016-12-24 – 2016-12-25 (×2): 1 via ORAL
  Filled 2016-12-23 (×2): qty 1

## 2016-12-23 MED ORDER — IBUPROFEN 600 MG PO TABS
600.0000 mg | ORAL_TABLET | Freq: Four times a day (QID) | ORAL | Status: DC
  Administered 2016-12-23 – 2016-12-25 (×7): 600 mg via ORAL
  Filled 2016-12-23 (×7): qty 1

## 2016-12-23 MED ORDER — FENTANYL 2.5 MCG/ML BUPIVACAINE 1/10 % EPIDURAL INFUSION (WH - ANES)
14.0000 mL/h | INTRAMUSCULAR | Status: DC | PRN
  Administered 2016-12-23: 14 mL/h via EPIDURAL
  Filled 2016-12-23: qty 100

## 2016-12-23 NOTE — Anesthesia Preprocedure Evaluation (Signed)
Anesthesia Evaluation  Patient identified by MRN, date of birth, ID band Patient awake    Reviewed: Allergy & Precautions, H&P , NPO status , Patient's Chart, lab work & pertinent test results  Airway Mallampati: II  TM Distance: >3 FB Neck ROM: full    Dental no notable dental hx.    Pulmonary neg pulmonary ROS, former smoker,    Pulmonary exam normal breath sounds clear to auscultation       Cardiovascular negative cardio ROS Normal cardiovascular exam     Neuro/Psych negative neurological ROS  negative psych ROS   GI/Hepatic negative GI ROS, Neg liver ROS,   Endo/Other  negative endocrine ROS  Renal/GU negative Renal ROS     Musculoskeletal   Abdominal (+) + obese,   Peds  Hematology negative hematology ROS (+)   Anesthesia Other Findings   Reproductive/Obstetrics (+) Pregnancy                             Anesthesia Physical  Anesthesia Plan  ASA: II  Anesthesia Plan: Epidural   Post-op Pain Management:    Induction:   PONV Risk Score and Plan:   Airway Management Planned:   Additional Equipment:   Intra-op Plan:   Post-operative Plan:   Informed Consent: I have reviewed the patients History and Physical, chart, labs and discussed the procedure including the risks, benefits and alternatives for the proposed anesthesia with the patient or authorized representative who has indicated his/her understanding and acceptance.     Plan Discussed with:   Anesthesia Plan Comments:         Anesthesia Quick Evaluation

## 2016-12-23 NOTE — Consult Note (Signed)
Neonatology Note:  Attendance at Code Apgar:   Our team responded to a Code Apgar call to room # 173 following NSVD, due to infant with poor/inconsistent respiratory effort. The requesting physician was Dr. Eugene Gavia. The mother is a G3P2 O pos, GBS neg with VBAC. ROM occurred 4 hours PTD and the fluid was clear. Delivery was precipitous. At delivery, the baby had some respiratory effort, but did not pink up well and was brought to the warmer. The OB nursing staff in attendance gave vigorous stimulation, did some bulb suctioning, but the baby reportedly continued to not respond with vigor and a Code Apgar was called. Our team arrived at 4.5 minutes of life, at which time the baby was getting BBO2 and O2 saturations were rising into the 80s. The baby was staring a little, appeared stunned, but recovering. We continued BBO2 for another 2-3 minutes, then weaned it away. Ap not yet given by OB staff/9. The baby's lungs are clear with good air exchange and HR is normal, perfusion good. Infant with good tone. She maintained good O2 sats in room air for several minutes as we observed her. I spoke with the parents in the DR, then transferred the baby to the Pediatrician's care. I spoke with the supervising nurse and asked her to call me for further problems during transition.  Real Cons, MD

## 2016-12-23 NOTE — Progress Notes (Signed)
Patient ID: [name], female   DOB: [birth year], 26 y.o.   MRN: [id number] Delayed note due to being in another delivery  Pt received epidural and got comfortable  afeb VSS FHR 150 but had an episode of dep variable decels following a position change.  FHR responded to O2 and placing patient on left side with d/c of pitocin.  Now is back to almost a category 1 tracing with occasional mild variables, good long term variability  Cervix  C/8-9/-1 OP  Pt making rapid progress now, hope to be able to push soon and will watch FHR closely. Pitocin at 1 mu

## 2016-12-23 NOTE — Anesthesia Procedure Notes (Signed)
Epidural Patient location during procedure: OB  Staffing Anesthesiologist: Alexarae Oliva, MD Performed: anesthesiologist   Preanesthetic Checklist Completed: patient identified, pre-op evaluation, timeout performed, IV checked, risks and benefits discussed and monitors and equipment checked  Epidural Patient position: sitting Prep: site prepped and draped and DuraPrep Patient monitoring: heart rate, continuous pulse ox and blood pressure Approach: midline Location: L3-L4 Injection technique: LOR air and LOR saline  Needle:  Needle type: Tuohy  Needle gauge: 17 G Needle length: 9 cm Needle insertion depth: 6 cm Catheter type: closed end flexible Catheter size: 19 Gauge Catheter at skin depth: 11 cm Test dose: negative  Assessment Sensory level: T8 Events: blood not aspirated, injection not painful, no injection resistance, negative IV test and no paresthesia  Additional Notes Reason for block:procedure for pain     

## 2016-12-23 NOTE — Progress Notes (Signed)
Patient ID: [name], female   DOB: [birth year], 26 y.o.   MRN: [id number] Pt admitted and started on pitocin, feeling mild cramping  afeb VSS Cervix 70/3+/-2  FHR with some decreased variabiilty initially and possible occasional mild decel.  Responded to fluid bolus and now good variability and 10 by 10 accels  AROM with FSE applied to follow FHR carefully Pitocin per protocol Plans epidural

## 2016-12-23 NOTE — Progress Notes (Signed)
Asked to attend this delivery as second Therapist, sports.  This term infant with clear fluid delivered with one push by mother, following repetitive variable decelerations. Infant placed skin to skin. Poor tone and minimal respiratory effort noted.  Vigorously dried and stimulated and suctioned with bulb syringe in mouth and nose with minimal results. Stimulation continued and infant appeared to be trying to cry but no sound came with the effort.  HR WNL. Mouth and nose again suctioned with minimal results.  Umbilical cord was clamped and infant taken to the warmer at approximately 1 min.  I continued to dry and stimulate the infant while the primary RN prepared to place the pulse oximeter.  SpO2 36% upon placement and HR 140s. Blow by with 10 L O2 started.  SpO2 increased to the 40's.  HR 130s. Tone remained poor and infant cry weak.  HR dropped into the 70 's. Code Apgar activated and  PPV administered x 4 breaths at which time infant coughed and cried.  HR increased to 170's.  PPV discontinued. SpO2 remained in the 50's.  NICU team to bedside.  See note from Dr. Pierre Bali, Neonatologist.

## 2016-12-24 LAB — CBC
HCT: 30.2 % — ABNORMAL LOW (ref 36.0–46.0)
Hemoglobin: 9.5 g/dL — ABNORMAL LOW (ref 12.0–15.0)
MCH: 23.5 pg — ABNORMAL LOW (ref 26.0–34.0)
MCHC: 31.5 g/dL (ref 30.0–36.0)
MCV: 74.8 fL — ABNORMAL LOW (ref 78.0–100.0)
PLATELETS: 169 10*3/uL (ref 150–400)
RBC: 4.04 MIL/uL (ref 3.87–5.11)
RDW: 17 % — AB (ref 11.5–15.5)
WBC: 14.2 10*3/uL — AB (ref 4.0–10.5)

## 2016-12-24 NOTE — Lactation Note (Signed)
This note was copied from a baby's chart. Lactation Consultation Note  Patient Name: [name] Today's Date: 12/24/2016 Reason for consult: Initial assessment    P3, Baby 46 hours old and sleepy.  Mother is breastfeeding and formula feeding. Mother states she felt she had low milk supply with other children.  Discussed volume amt (teaspoon) at this time of life. Discussed supply and demand and the importance of breastfeeding before offering formula to help her establish her milk supply. Mom has my # to call for assist w/next feeding. Mom encouraged to feed baby 8-12 times/24 hours and with feeding cues.  Mom made aware of O/P services, breastfeeding support groups, community resources, and our phone # for post-discharge questions.      Maternal Data Has patient been taught Hand Expression?: Yes Does the patient have breastfeeding experience prior to this delivery?: Yes  Feeding    LATCH Score                   Interventions    Lactation Tools Discussed/Used     Consult Status Consult Status: Follow-up Date: 12/25/16 Follow-up type: In-patient    Vivianne Master [facility] 12/24/2016, 1:09 PM

## 2016-12-24 NOTE — Anesthesia Postprocedure Evaluation (Signed)
Anesthesia Post Note  Patient: [name]  Procedure(s) Performed: AN AD [place]     Patient location during evaluation: Mother Baby Anesthesia Type: Epidural Level of consciousness: awake and alert and oriented Pain management: pain level controlled Vital Signs Assessment: post-procedure vital signs reviewed and stable Respiratory status: spontaneous breathing Cardiovascular status: stable Postop Assessment: no headache, no apparent nausea or vomiting, no backache, adequate PO intake, epidural receding and patient able to bend at knees Anesthetic complications: no    Last Vitals:  Vitals:   12/23/16 2024 12/24/16 0507  BP: 128/62 119/73  Pulse: 80 74  Resp: 18 18  Temp: 36.4 C (!) 36.3 C  SpO2:      Last Pain:  Vitals:   12/24/16 0508  TempSrc:   PainSc: 6    Pain Goal:                 Jabier Mutton

## 2016-12-24 NOTE — Lactation Note (Signed)
This note was copied from a baby's chart. Lactation Consultation Note  Patient Name: [name] [id number] Date: 12/24/2016 Reason for consult: Follow-up assessment;Mother's request;Nipple pain/trauma   Follow up with mom for feeding assistance . Mom reports she tried infant on the left breast and it hurt so she latched infant to the right breast. Mom had infant latched to right breast in the cross cradle hold. Infant fully dressed and in blanket. Enc mom to feed infant STS with feedings.  Infant was shallowly latched and upper and lower lip needed flanging. Showed mom how to flange lips. Infant unlatched and relatched for a deeper latch. Mom's nipple was compressed when infant came off. Discussed importance of deep latch for milk transfer, engorgement prevention and protection of milk supply, mom voiced understanding.   Mom is noted to have short tubular shaped breasts and reports she hs to start supplementing her son at 1.5 months as he was always hungry.   Infant was still feeding when [place] left room. Mom reports no pain at this time. Enc mom to call out for further feeding assistance as needed.    Maternal Data Has patient been taught Hand Expression?: Yes Does the patient have breastfeeding experience prior to this delivery?: Yes  Feeding Feeding Type: Breast Fed Length of feed: 10 min(still feeding when LC left room)  LATCH Score Latch: Grasps breast easily, tongue down, lips flanged, rhythmical sucking.  Audible Swallowing: A few with stimulation  Type of Nipple: Everted at rest and after stimulation  Comfort (Breast/Nipple): Filling, red/small blisters or bruises, mild/mod discomfort  Hold (Positioning): Assistance needed to correctly position infant at breast and maintain latch.  LATCH Score: 7  Interventions Interventions: Breast feeding basics reviewed;Support pillows;Assisted with latch;Position options;Skin to skin;Breast compression  Lactation Tools  Discussed/Used     Consult Status Consult Status: Follow-up Date: 12/25/16 Follow-up type: In-patient    Debby Freiberg Hice 12/24/2016, 2:32 PM

## 2016-12-24 NOTE — Progress Notes (Signed)
PPD #1 No problems Afeb, VSS Fundus firm, NT at U-0 Continue routine postpartum care

## 2016-12-25 MED ORDER — PRENATAL 27-0.8 MG PO TABS: 1.0000 | ORAL_TABLET | Freq: Every day | ORAL | 3 refills | Status: DC

## 2016-12-25 MED ORDER — IBUPROFEN 600 MG PO TABS: 600.0000 mg | ORAL_TABLET | Freq: Four times a day (QID) | ORAL | 1 refills | Status: DC | PRN

## 2016-12-25 NOTE — Progress Notes (Signed)
Post Partum Day 2 Subjective: no complaints, up ad lib, voiding, tolerating PO and nl lochia, pain controlled  Objective: Blood pressure 119/80, pulse 78, temperature 97.8 F (36.6 C), temperature source Oral, resp. rate 16, height 5\' 9"  (1.753 m), weight 114.8 kg (253 lb), last menstrual period 03/18/2016, SpO2 100 %, unknown if currently breastfeeding.  Physical Exam:  General: alert and no distress Lochia: appropriate Uterine Fundus: firm  Recent Labs    12/23/16 0805 12/24/16 0705  HGB 10.3* 9.5*  HCT 32.8* 30.2*    Assessment/Plan: Discharge home, Breastfeeding and Lactation consult.  Routine PP care.  D/w with Motrin and PNV.  F/u 6 weeks   LOS: 2 days   [name] 12/25/2016, 7:04 AM

## 2016-12-25 NOTE — Lactation Note (Signed)
This note was copied from a baby's chart. Lactation Consultation Note  Patient Name: [name] [id number] Date: 12/25/2016 Reason for consult: Follow-up assessment;Other (Comment)(low milk supply with older child)  Follow up visit at 65 hours of age. Mom reports some feedings are 30-45 minutes and baby is still hungry so she if giving baby some formula.   Mom reports LC helped her with getting better latch with flanged lips for feedings.  Mom reports improved comfort with feedings.  Mom reports she is hearing swallows and baby is active during feedings.  Mom reports baby becomes more sleepy with breast compressions during feedings.  Lc encouraged mom to allow baby STS before or after feedings.  LC encouraged mom to post pump anytime baby is getting formula to help increase her supply.  Baby asleep in crib, mom awaiting discharge.  Discussed milk transitioning to larger volume, engorgement care discussed.  Encouraged frequent feedings, 8-12 per day. Mom to soften breast as needed prior to latch.  Mom aware of o/p services.     Maternal Data Has patient been taught Hand Expression?: Yes  Feeding Feeding Type: Breast Fed Length of feed: 10 min  LATCH Score                   Interventions Interventions: Breast feeding basics reviewed  Lactation Tools Discussed/Used     Consult Status Consult Status: Complete    Mohamed Portlock 12/25/2016, 10:01 AM

## 2016-12-25 NOTE — Discharge Summary (Signed)
OB Discharge Summary     Patient Name: [name] DOB: 1990-05-31 MRN: [id number]  Date of admission: 12/23/2016 Delivering MD: Paula Compton   Date of discharge: 12/25/2016  Admitting diagnosis: 40wks induction Intrauterine pregnancy: [redacted]w[redacted]d     Secondary diagnosis:  Active Problems:   Indication for care in labor and delivery, antepartum   VBAC (vaginal birth after Cesarean)  Additional problems:none     Discharge diagnosis: Term Pregnancy Delivered                                                                                                Post partum procedures:none  Augmentation: AROM and Pitocin  Complications: None  Hospital course:  Induction of Labor With Vaginal Delivery   26 y.o. yo [id number] at [redacted]w[redacted]d was admitted to the hospital 12/23/2016 for induction of labor.  Indication for induction: Favorable cervix at term.  Patient had an uncomplicated labor course as follows: Membrane Rupture Time/Date: 9:08 AM ,12/23/2016   Intrapartum Procedures: Episiotomy: None [1]                                         Lacerations:  Periurethral [8]  Patient had delivery of a Viable infant.  Information for the patient's newborn:  Jashiya, Bassett [[id number]]  Delivery Method: Vag-Spont   12/23/2016  Details of delivery can be found in separate delivery note.  Patient had a routine postpartum course. Patient is discharged home 12/25/16.  Physical exam  Vitals:   12/23/16 2024 12/24/16 0507 12/24/16 1858 12/25/16 0500  BP: 128/62 119/73 122/78 119/80  Pulse: 80 74 92 78  Resp: 18 18 18 16   Temp: 97.6 F (36.4 C) (!) 97.4 F (36.3 C) 98 F (36.7 C) 97.8 F (36.6 C)  TempSrc: Oral Oral Oral Oral  SpO2:      Weight:      Height:       General: alert and no distress Lochia: appropriate Uterine Fundus: firm  Labs: Lab Results  Component Value Date   WBC 14.2 (H) 12/24/2016   HGB 9.5 (L) 12/24/2016   HCT 30.2 (L) 12/24/2016   MCV 74.8 (L) 12/24/2016    PLT 169 12/24/2016   CMP Latest Ref Rng & Units 01/15/2014  Glucose 70 - 99 mg/dL 101(H)  BUN 6 - 23 mg/dL 12  Creatinine 0.50 - 1.10 mg/dL 0.70  Sodium 137 - 147 mEq/L 138  Potassium 3.7 - 5.3 mEq/L 4.6  Chloride 96 - 112 mEq/L 104  CO2 19 - 32 mEq/L 18(L)  Calcium 8.4 - 10.5 mg/dL 9.8  Total Protein 6.0 - 8.3 g/dL 7.9  Total Bilirubin 0.3 - 1.2 mg/dL 0.8  Alkaline Phos 39 - 117 U/L 94  AST 0 - 37 U/L 28  ALT 0 - 35 U/L 33    Discharge instruction: per After Visit Summary and "Baby and Me Booklet".  After visit meds:  Allergies as of 12/25/2016   No Known Allergies     Medication List  TAKE these medications   ibuprofen 600 MG tablet Commonly known as:  ADVIL,MOTRIN Take 1 tablet (600 mg total) every 6 (six) hours as needed by mouth. What changed:    when to take this  reasons to take this   multivitamin-prenatal 27-0.8 MG Tabs tablet Take 1 tablet daily at 12 noon by mouth.       Diet: routine diet  Activity: Advance as tolerated. Pelvic rest for 6 weeks.   Outpatient follow up:6 weeks Follow up Appt:No future appointments. Follow up Visit:No Follow-up on file.  Postpartum contraception: Undecided  Newborn Data: Live born female  Birth Weight: 7 lb 1.9 oz (3229 g) APGAR: 6, 9  Newborn Delivery   Birth date/time:  12/23/2016 13:30:00 Delivery type:  VBAC, Spontaneous     Baby Feeding: Breast Disposition:home with mother   12/25/2016 Janyth Contes, MD

## 2017-09-27 ENCOUNTER — Encounter (HOSPITAL_COMMUNITY): Payer: Self-pay | Admitting: Emergency Medicine

## 2017-09-27 ENCOUNTER — Emergency Department (HOSPITAL_COMMUNITY)
Admission: EM | Admit: 2017-09-27 | Discharge: 2017-09-28 | Disposition: A | Discharge status: Home / Self Care | Payer: Self-pay | Attending: Emergency Medicine | Admitting: Emergency Medicine

## 2017-09-27 ENCOUNTER — Other Ambulatory Visit: Payer: Self-pay

## 2017-09-27 DIAGNOSIS — Z79899 Other long term (current) drug therapy: Secondary | ICD-10-CM | POA: Insufficient documentation

## 2017-09-27 DIAGNOSIS — Z87891 Personal history of nicotine dependence: Secondary | ICD-10-CM | POA: Insufficient documentation

## 2017-09-27 DIAGNOSIS — A879 Viral meningitis, unspecified: Secondary | ICD-10-CM | POA: Insufficient documentation

## 2017-09-27 LAB — CBC
HCT: 42.3 % (ref 36.0–46.0)
HEMOGLOBIN: 13.6 g/dL (ref 12.0–15.0)
MCH: 27 pg (ref 26.0–34.0)
MCHC: 32.2 g/dL (ref 30.0–36.0)
MCV: 84.1 fL (ref 78.0–100.0)
Platelets: 179 10*3/uL (ref 150–400)
RBC: 5.03 MIL/uL (ref 3.87–5.11)
RDW: 14.3 % (ref 11.5–15.5)
WBC: 6.8 10*3/uL (ref 4.0–10.5)

## 2017-09-27 LAB — COMPREHENSIVE METABOLIC PANEL
ALBUMIN: 4.3 g/dL (ref 3.5–5.0)
ALT: 58 U/L — AB (ref 0–44)
ANION GAP: 12 (ref 5–15)
AST: 40 U/L (ref 15–41)
Alkaline Phosphatase: 89 U/L (ref 38–126)
BUN: 9 mg/dL (ref 6–20)
CO2: 22 mmol/L (ref 22–32)
CREATININE: 0.89 mg/dL (ref 0.44–1.00)
Calcium: 9.7 mg/dL (ref 8.9–10.3)
Chloride: 107 mmol/L (ref 98–111)
GFR calc non Af Amer: >60 mL/min (ref >60)
Glucose, Bld: 103 mg/dL — ABNORMAL HIGH (ref 70–99)
Potassium: 3.7 mmol/L (ref 3.5–5.1)
Sodium: 141 mmol/L (ref 135–145)
Total Bilirubin: 0.8 mg/dL (ref 0.3–1.2)
Total Protein: 8.1 g/dL (ref 6.5–8.1)

## 2017-09-27 LAB — I-STAT BETA HCG BLOOD, ED (MC, WL, AP ONLY)

## 2017-09-27 MED ORDER — OXYCODONE-ACETAMINOPHEN 5-325 MG PO TABS
1.0000 | ORAL_TABLET | ORAL | Status: DC | PRN
  Administered 2017-09-27: 1 via ORAL
  Filled 2017-09-27: qty 1

## 2017-09-27 NOTE — ED Triage Notes (Addendum)
Pt endorses headache x 1 week not relieved with ibuprofen. Chills and low grade fever. Unable to turn her head without pain to the back of head.  Today has just felt "weak and tired" States she had recently started birth control and then started having "pregnancy symptoms" so she stopped the birth control approximately 4 days ago.

## 2017-09-28 ENCOUNTER — Emergency Department (HOSPITAL_COMMUNITY): Payer: Self-pay

## 2017-09-28 LAB — CSF CELL COUNT WITH DIFFERENTIAL
Eosinophils, CSF: 0 % (ref 0–1)
Lymphs, CSF: 77 % (ref 40–80)
MONOCYTE-MACROPHAGE-SPINAL FLUID: 18 % (ref 15–45)
RBC COUNT CSF: 30 /mm3 — AB
SEGMENTED NEUTROPHILS-CSF: 5 % (ref 0–6)
Tube #: 4
WBC CSF: 1045 /mm3 — AB (ref 0–5)

## 2017-09-28 LAB — URINALYSIS, ROUTINE W REFLEX MICROSCOPIC
BILIRUBIN URINE: NEGATIVE
Bacteria, UA: NONE SEEN
GLUCOSE, UA: NEGATIVE mg/dL
KETONES UR: NEGATIVE mg/dL
LEUKOCYTES UA: NEGATIVE
NITRITE: NEGATIVE
PH: 5 (ref 5.0–8.0)
Protein, ur: NEGATIVE mg/dL
SPECIFIC GRAVITY, URINE: 1.021 (ref 1.005–1.030)

## 2017-09-28 LAB — PROTEIN, CSF: TOTAL PROTEIN, CSF: 49 mg/dL — AB (ref 15–45)

## 2017-09-28 LAB — PATHOLOGIST SMEAR REVIEW

## 2017-09-28 LAB — GLUCOSE, CSF: Glucose, CSF: 60 mg/dL (ref 40–70)

## 2017-09-28 MED ORDER — SODIUM CHLORIDE 0.9 % IV BOLUS
1000.0000 mL | Freq: Once | INTRAVENOUS | Status: AC
  Administered 2017-09-28: 1000 mL via INTRAVENOUS

## 2017-09-28 MED ORDER — ACETAMINOPHEN 325 MG PO TABS
650.0000 mg | ORAL_TABLET | Freq: Once | ORAL | Status: AC
  Administered 2017-09-28: 650 mg via ORAL
  Filled 2017-09-28: qty 2

## 2017-09-28 MED ORDER — LIDOCAINE-EPINEPHRINE (PF) 2 %-1:200000 IJ SOLN
10.0000 mL | Freq: Once | INTRAMUSCULAR | Status: AC
  Administered 2017-09-28: 10 mL via INTRADERMAL
  Filled 2017-09-28: qty 20

## 2017-09-28 NOTE — ED Notes (Signed)
Patient transported to CT, RN attempted to start IV, unsuccessful.

## 2017-09-28 NOTE — ED Notes (Signed)
Informed provider of critical lab values

## 2017-09-28 NOTE — Discharge Instructions (Signed)
You may take over-the-counter medicine for symptomatic relief, such as Tylenol, Motrin, TheraFlu, Alka seltzer , black elderberry, etc. Please limit acetaminophen (Tylenol) to 4000 mg and Ibuprofen (Motrin, Advil, etc.) to 2400 mg for a 24hr period. Please note that other over-the-counter medicine may contain acetaminophen or ibuprofen as a component of their ingredients.   

## 2017-09-28 NOTE — ED Provider Notes (Signed)
[facility]PART[facility] Provider Note  CSN: [id number] Arrival date & time: 09/27/17 2101  Chief Complaint(s) Headache  HPI [name] is a 27 y.o. female   The history is provided by the patient.  Headache   This is a new problem. Episode onset: 5 days. The problem occurs constantly. The problem has been gradually worsening. The headache is associated with bright light. The pain is located in the temporal (nuchal) region. The quality of the pain is described as dull and throbbing. The pain is moderate. Associated symptoms include a fever (subjective fever started today), malaise/fatigue and nausea. Pertinent negatives include no shortness of breath and no vomiting. She has tried NSAIDs for the symptoms. The treatment provided no relief.   Patient reports that daughter had URI sxs last week. No fevers.   No h/o HSV, HIV, IVDU.    Past Medical History Past Medical History:  Diagnosis Date  . No pertinent past medical history    Patient Active Problem List   Diagnosis Date Noted  . Indication for care in labor and delivery, antepartum 12/23/2016  . VBAC (vaginal birth after Cesarean) 12/23/2016  . Indication for care in labor or delivery 09/05/2015  . VBAC, delivered, current hospitalization 09/05/2015   Home Medication(s) Prior to Admission medications   Medication Sig Start Date End Date Taking? Authorizing Provider  ibuprofen (ADVIL,MOTRIN) 600 MG tablet Take 1 tablet (600 mg total) every 6 (six) hours as needed by mouth. 12/25/16   Bovard-Stuckert, Jody, MD  Prenatal Vit-Fe Fumarate-FA (MULTIVITAMIN-PRENATAL) 27-0.8 MG TABS tablet Take 1 tablet daily at 12 noon by mouth. 12/25/16   Bovard-Stuckert, Jeral Fruit, MD                                                                                                                                    Past Surgical History Past Surgical History:  Procedure Laterality Date  . 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 CHOLECYSTECTOMY    . NO PAST SURGERIES    . 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 Down syndrome Other        maternal half-nephew  . Diabetes Mother   . Diabetes Maternal Grandmother   . Diabetes Paternal Grandmother   . Other Neg Hx     Social History Social History   Tobacco Use  . Smoking status: Former Smoker    Packs/day: 0.50    Types: Cigarettes  . Smokeless tobacco: Former Systems developer    Quit date: 12/11/2014  Substance Use Topics  . Alcohol use: No  . Drug use: No   Allergies Patient has no known allergies.  Review of Systems Review of Systems  Constitutional: Positive for fever (subjective fever started today) and malaise/fatigue.  HENT: Negative for congestion, postnasal drip and rhinorrhea.   Respiratory: Negative for cough and shortness of breath.   Gastrointestinal: Positive for nausea. Negative for abdominal pain, diarrhea and vomiting.  Genitourinary: Negative for dysuria.  Neurological: Positive for headaches.   All other systems are reviewed and are negative for acute change except as noted in the HPI  Physical Exam Vital Signs  I have reviewed the triage vital signs BP 125/80   Pulse (!) 109   Temp (!) 100.7 F (38.2 C) (Oral)   Resp 20   Ht 5\' 9"  (1.753 m)   Wt 102.5 kg   SpO2 99%   BMI 33.37 kg/m   Physical Exam  Constitutional: She is oriented to person, place, and time. She appears well-developed and well-nourished. No distress.  HENT:  Head: Normocephalic and atraumatic.  Nose: Nose normal.  Eyes: Pupils are equal, round, and reactive to light. Conjunctivae and EOM are normal. Right eye exhibits no discharge. Left eye exhibits no discharge. No scleral icterus.  Fundoscopic  exam:      The right eye shows no papilledema.       The left eye shows no papilledema.  Neck: Normal range of motion. Neck supple. No spinous process tenderness and no muscular tenderness present. No neck rigidity. Normal range of motion present. No Brudzinski's sign and no Kernig's sign noted.  Cardiovascular: Normal rate and regular rhythm. Exam reveals no gallop and no friction rub.  No murmur heard. Pulmonary/Chest: Effort normal and breath sounds normal. No stridor. No respiratory distress. She has no rales.  Abdominal: Soft. She exhibits no distension. There is no tenderness.  Musculoskeletal: She exhibits no edema or tenderness.  Neurological: She is alert and oriented to person, place, and time.  Mental Status:  Alert and oriented to person, place, and time.  Attention and concentration normal.  Speech clear.  Recent memory is intact  Cranial Nerves:  II Visual Fields: Intact to confrontation. Visual fields intact. III, IV, VI: Pupils equal and reactive to light and near. Full eye movement without nystagmus  V Facial Sensation: Normal. No weakness of masticatory muscles  VII: No facial weakness or asymmetry  VIII Auditory Acuity: Grossly normal  IX/X: The uvula is midline; the palate elevates symmetrically  XI: Normal sternocleidomastoid and trapezius strength  XII: The tongue is midline. No atrophy or fasciculations.   Motor System: Muscle Strength: 5/5 and symmetric in the upper and lower extremities. No pronation or drift.  Muscle Tone: Tone and muscle bulk are normal in the upper and lower extremities.   Reflexes: DTRs: 1+ and symmetrical in all four extremities. No Clonus Coordination: Intact finger-to-nose, heel-to-shin. No tremor.  Sensation: Intact to light touch, and pinprick. Negative Romberg test.  Gait: Routine and tandem gait normal.   Skin: Skin is warm and dry. No rash noted. She is not diaphoretic. No erythema.  Psychiatric: She has a normal mood and affect.   Vitals reviewed.   ED Results and Treatments Labs (all labs ordered are listed, but only abnormal results are displayed) Labs Reviewed  COMPREHENSIVE METABOLIC PANEL - Abnormal; Notable for the following components:      Result Value   Glucose, Bld 103 (*)    ALT 58 (*)    All other components within normal limits  URINALYSIS, ROUTINE W REFLEX MICROSCOPIC - Abnormal; Notable for the following components:   Hgb urine dipstick  SMALL (*)    All other components within normal limits  CSF CELL COUNT WITH DIFFERENTIAL - Abnormal; Notable for the following components:   Appearance, CSF CLEAR (*)    RBC Count, CSF 35 (*)    WBC, CSF 825 (*)    Segmented Neutrophils-CSF 12 (*)    Monocyte-Macrophage-Spinal Fluid 10 (*)    All other components within normal limits  CSF CELL COUNT WITH DIFFERENTIAL - Abnormal; Notable for the following components:   RBC Count, CSF 30 (*)    WBC, CSF 1,045 (*)    All other components within normal limits  PROTEIN, CSF - Abnormal; Notable for the following components:   Total  Protein, CSF 49 (*)    All other components within normal limits  CSF CULTURE  CBC  GLUCOSE, CSF  I-STAT BETA HCG BLOOD, ED ([facility], [facility], [facility] ONLY)  I-STAT BETA HCG BLOOD, ED ([facility], [facility], [facility] ONLY)                                                                                                                         EKG  EKG Interpretation  Date/Time:    Ventricular Rate:    PR Interval:    QRS Duration:   QT Interval:    QTC Calculation:   R Axis:     Text Interpretation:        Radiology Ct Head Wo Contrast  Result Date: 09/28/2017 CLINICAL DATA:  Headache, new, meningitis or encephalitis suspected. EXAM: CT HEAD WITHOUT CONTRAST TECHNIQUE: Contiguous axial images were obtained from the base of the skull through the vertex without intravenous contrast. COMPARISON:  None. FINDINGS: Brain: No intracranial hemorrhage, mass effect, or midline shift. No hydrocephalus. The basilar  cisterns are patent. No evidence of territorial infarct or acute ischemia. No extra-axial or intracranial fluid collection. Vascular: No hyperdense vessel or unexpected calcification. Skull: No fracture or focal lesion. Sinuses/Orbits: Mucosal thickening with fluid level in left side of sphenoid sinus. Remaining paranasal sinuses are clear. The mastoid air cells are well-aerated. Other: None. IMPRESSION: 1.  No acute intracranial abnormality. 2. Left sphenoid sinusitis, fluid level suggests an acute component. Electronically Signed   By: Jeb Levering M.D.   On: 09/28/2017 03:10   Pertinent labs & imaging results that were available during my care of the patient were reviewed by me and considered in my medical decision making (see chart for details).  Medications Ordered in ED Medications  oxyCODONE-acetaminophen (PERCOCET/ROXICET) 5-325 MG per tablet 1 tablet (1 tablet Oral Given 09/27/17 2316)  lidocaine-EPINEPHrine (XYLOCAINE W/EPI) 2 %-1:200000 (PF) injection 10 mL (10 mLs Intradermal Given by Other 09/28/17 0328)  sodium chloride 0.9 % bolus 1,000 mL (0 mLs Intravenous Stopped 09/28/17 0538)  acetaminophen (TYLENOL) tablet 650 mg (650 mg Oral Given 09/28/17 0332)  Procedures .Lumbar Puncture Date/Time: 09/28/2017 6:35 AM Performed by: Fatima Blank, MD Authorized by: Fatima Blank, MD   Consent:    Consent obtained:  Verbal   Consent given by:  Patient   Risks discussed:  Infection, headache, bleeding, nerve damage and pain Pre-procedure details:    Procedure purpose:  Diagnostic   Preparation: Patient was prepped and draped in usual sterile fashion   Anesthesia (see MAR for exact dosages):    Anesthesia method:  Local infiltration   Local anesthetic:  Lidocaine 2% WITH epi Procedure details:    Lumbar space:  L3-L4 interspace   Patient  position:  L lateral decubitus   Needle gauge:  18   Needle type:  Spinal needle - Quincke tip   Needle length (in):  3.5   Ultrasound guidance: no     Number of attempts:  2   Opening pressure (cm H2O):  35   Fluid appearance:  Clear   Tubes of fluid:  4   Total volume (ml):  9 Post-procedure:    Puncture site:  Adhesive bandage applied   Patient tolerance of procedure:  Tolerated well, no immediate complications    (including critical care time)  Medical Decision Making / ED Course I have reviewed the nursing notes for this encounter and the patient's prior records (if available in EHR or on provided paperwork).    Patient is febrile with headache and nuchal pain.  Reported sick contacts at home with likely viral process.  Suspicion for viral meningitis.  Patient is relatively well-appearing and nontoxic.  Exam is nonfocal.  CT head negative.  CBC without leukocytosis.  Chemistry panel without significant electrolyte derangements or renal insufficiency.  Beta hCG negative.  Lumbar puncture consistent with viral meningitis.  Gram stain with no organisms seen.  Cultures pending.  Patient treated with IV fluids and Tylenol resulting in resolution of patient's headache and myalgias.  On reassessment, patient was able to tolerate oral hydration.  Ambulates without complication.  Given her relative all appearance, no empiric antibiotics were given.  She was given the choice of admission versus close follow-up.  With shared decision making, patient chose to be discharged home and return in 1 to 2 days for close follow-up.  The patient is safe for discharge with strict return precautions.   Final Clinical Impression(s) / ED Diagnoses Final diagnoses:  Viral meningitis   Disposition: Discharge  Condition: Good  I have discussed the results, Dx and Tx plan with the patient who expressed understanding and agree(s) with the plan. Discharge instructions discussed at great length. The  patient was given strict return precautions who verbalized understanding of the instructions. No further questions at time of discharge.    ED Discharge Orders    None       Follow Up: [facility] [address] [id number] [place] [phone] Go to  in 1-2 days for close follow, reevaluation, and culture results.      This chart was dictated using voice recognition software.  Despite best efforts to proofread,  errors can occur which can change the documentation meaning.   Fatima Blank, MD 09/28/17 [phone]

## 2017-09-28 NOTE — ED Notes (Signed)
Assisted MD to do bedside lumbar puncture. Patient tolerated it well.

## 2017-09-29 LAB — CSF CELL COUNT WITH DIFFERENTIAL
Eosinophils, CSF: 0 % (ref 0–1)
LYMPHS CSF: 78 % (ref 40–80)
MONOCYTE-MACROPHAGE-SPINAL FLUID: 10 % — AB (ref 15–45)
RBC COUNT CSF: 35 /mm3 — AB
SEGMENTED NEUTROPHILS-CSF: 12 % — AB (ref 0–6)
Tube #: 1
WBC CSF: 825 /mm3 — AB (ref 0–5)

## 2017-09-29 NOTE — ED Notes (Signed)
09/29/2017, Pt. Called for CSF culture results,  Results are pending and informed pt.  She verbalized understanding and all questions answered.

## 2017-10-01 LAB — CSF CULTURE: CULTURE: NO GROWTH

## 2017-10-01 LAB — CSF CULTURE W GRAM STAIN

## 2018-12-16 ENCOUNTER — Encounter (HOSPITAL_COMMUNITY): Payer: Self-pay | Admitting: Emergency Medicine

## 2018-12-16 ENCOUNTER — Emergency Department (HOSPITAL_COMMUNITY)
Admission: EM | Admit: 2018-12-16 | Discharge: 2018-12-16 | Disposition: A | Discharge status: Home / Self Care | Payer: Medicaid Other | Attending: Emergency Medicine | Admitting: Emergency Medicine

## 2018-12-16 ENCOUNTER — Other Ambulatory Visit: Payer: Self-pay

## 2018-12-16 DIAGNOSIS — F17210 Nicotine dependence, cigarettes, uncomplicated: Secondary | ICD-10-CM | POA: Insufficient documentation

## 2018-12-16 DIAGNOSIS — F1210 Cannabis abuse, uncomplicated: Secondary | ICD-10-CM | POA: Insufficient documentation

## 2018-12-16 DIAGNOSIS — R21 Rash and other nonspecific skin eruption: Secondary | ICD-10-CM | POA: Insufficient documentation

## 2018-12-16 MED ORDER — PREDNISONE 10 MG PO TABS: 20.0000 mg | ORAL_TABLET | Freq: Every day | ORAL | 0 refills | Status: AC

## 2018-12-16 NOTE — ED Triage Notes (Signed)
Pt reports body rash that started one week ago. Pt reports it has started spreading to her back. Denies any pain or itching. No other family members with symptoms. Unsure of where it originated from - no new detergents, soaps. No drainage noted. Red papules across stomach, chest and back.

## 2018-12-16 NOTE — Discharge Instructions (Signed)
Recommend return to ER on Monday to have your skin rechecked.  If in the meantime if symptom worsens, you develop fever, drainage or other new concerning symptom please return to ER for reassessment at that time.  Please take steroids as prescribed.  If the rash goes away completely and you have no other new symptoms, you do not need to return to ER.

## 2018-12-16 NOTE — ED Notes (Signed)
Pt verbalized understanging of d/c instructions, follow up care and scripts. Had no further questions at this time

## 2018-12-17 NOTE — ED Provider Notes (Signed)
[facility]PART[facility] Provider Note   CSN: [id number] Arrival date & time: 12/16/18  2014     History   Chief Complaint Chief Complaint  Patient presents with  . Rash    HPI [name] is a 28 y.o. female.  Presents emerged from chief complaint of rash.  Patient states rash started around a week ago, has noted occasional spots over her abdomen and her back.  States that the spots are not painful, no itching.  No genital involvement, no facial involvement.  States she is never had a rash like this before.  No fevers.  No other complaints to remark on.  Has not taken any medication for this.     HPI  Past Medical History:  Diagnosis Date  . No pertinent past medical history     Patient Active Problem List   Diagnosis Date Noted  . Indication for care in labor and delivery, antepartum 12/23/2016  . VBAC (vaginal birth after Cesarean) 12/23/2016  . Indication for care in labor or delivery 09/05/2015  . VBAC, delivered, current hospitalization 09/05/2015    Past Surgical History:  Procedure Laterality Date  . 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 CHOLECYSTECTOMY    . NO PAST SURGERIES    . 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 Authorizing Provider  ibuprofen (ADVIL,MOTRIN) 600 MG tablet Take 1 tablet (600 mg total) every 6 (six) hours as needed by mouth. 12/25/16   Bovard-Stuckert, Jody, MD  predniSONE (DELTASONE) 10 MG tablet Take 2 tablets (20 mg total) by mouth daily  for 5 days. 12/16/18 12/21/18  Lucrezia Starch, MD  Prenatal Vit-Fe Fumarate-FA (MULTIVITAMIN-PRENATAL) 27-0.8 MG TABS tablet Take 1 tablet daily at 12 noon by mouth. 12/25/16   Bovard-Stuckert, Jeral Fruit, MD    Family History Family History  Problem Relation Age of Onset  . Down syndrome Other        maternal half-nephew  . Diabetes Mother   . Diabetes Maternal Grandmother   . Diabetes Paternal Grandmother   . Other Neg Hx     Social History Social History   Tobacco Use  . Smoking status: Current Every Day Smoker    Packs/day: 0.25    Types: Cigarettes  . Smokeless tobacco: Former Systems developer    Quit date: 12/11/2014  Substance Use Topics  . Alcohol use: Yes    Comment: occ  . Drug use: Yes    Types: Marijuana    Comment: occ     Allergies   Patient has no known allergies.   Review of Systems Review of Systems  Constitutional: Negative for chills and fever.  HENT: Negative for ear pain and sore throat.  Eyes: Negative for pain and visual disturbance.  Respiratory: Negative for cough and shortness of breath.   Cardiovascular: Negative for chest pain and palpitations.  Gastrointestinal: Negative for abdominal pain and vomiting.  Genitourinary: Negative for dysuria and hematuria.  Musculoskeletal: Negative for arthralgias and back pain.  Skin: Positive for rash. Negative for color change.  Neurological: Negative for seizures and syncope.  All other systems reviewed and are negative.    Physical Exam Updated Vital Signs BP 105/64 (BP Location: Right Arm)   Pulse 62   Temp 98.2 F (36.8 C) (Oral)   Resp 20   Ht 5\' 9"  (1.753 m)   Wt 107 kg   LMP 11/25/2018   SpO2 97%   BMI 34.85 kg/m   Physical Exam Vitals signs and nursing note reviewed.  Constitutional:      General: She is not in acute distress.    Appearance: She is well-developed.  HENT:     Head: Normocephalic and atraumatic.  Eyes:     Conjunctiva/sclera: Conjunctivae normal.  Neck:      Musculoskeletal: Neck supple.  Cardiovascular:     Rate and Rhythm: Normal rate and regular rhythm.     Heart sounds: No murmur.  Pulmonary:     Effort: Pulmonary effort is normal. No respiratory distress.     Breath sounds: Normal breath sounds.  Abdominal:     Palpations: Abdomen is soft.     Tenderness: There is no abdominal tenderness.  Skin:    General: Skin is warm and dry.     Comments: Few scattered raised lesions over abdomen, slightly raised, blanchable erythematous, not warm to touch ~1cm diameter; no rash over back or extremities  Neurological:     General: No focal deficit present.     Mental Status: She is alert and oriented to person, place, and time.      ED Treatments / Results  Labs (all labs ordered are listed, but only abnormal results are displayed) Labs Reviewed - No data to display  EKG None  Radiology No results found.  Procedures Procedures (including critical care time)  Medications Ordered in ED Medications - No data to display   Initial Impression / Assessment and Plan / ED Course  I have reviewed the triage vital signs and the nursing notes.  Pertinent labs & imaging results that were available during my care of the patient were reviewed by me and considered in my medical decision making (see chart for details).        28 y/o otherwise healthy lady presented to ER with rash on her abdomen. Noted mild, small patches of slightly raised erythema blanchable. No facial involvement, no genital involvement. Not itchy, no fever, no warmth to touch. Suspect possible allergic reaction. Will give trial of steroids. Recommend recheck with PCP or return to ER for recheck on Monday. Reviewed return precautions, will dc home.    After the discussed management above, the patient was determined to be safe for discharge.  The patient was in agreement with this plan and all questions regarding their care were answered.  ED return precautions were discussed  and the patient will return to the ED with any significant worsening of condition.    Final Clinical Impressions(s) / ED Diagnoses   Final diagnoses:  Rash    ED Discharge Orders         Ordered    predniSONE (DELTASONE) 10 MG tablet  Daily     12/16/18 2223  Lucrezia Starch, MD 12/17/18 [phone]

## 2019-07-03 ENCOUNTER — Emergency Department (HOSPITAL_COMMUNITY): Payer: Medicaid Other

## 2019-07-03 ENCOUNTER — Other Ambulatory Visit: Payer: Self-pay

## 2019-07-03 ENCOUNTER — Encounter (HOSPITAL_COMMUNITY): Payer: Self-pay

## 2019-07-03 ENCOUNTER — Emergency Department (HOSPITAL_COMMUNITY)
Admission: EM | Admit: 2019-07-03 | Discharge: 2019-07-04 | Disposition: A | Discharge status: Home / Self Care | Payer: Medicaid Other | Attending: Emergency Medicine | Admitting: Emergency Medicine

## 2019-07-03 DIAGNOSIS — Z3491 Encounter for supervision of normal pregnancy, unspecified, first trimester: Secondary | ICD-10-CM | POA: Diagnosis not present

## 2019-07-03 DIAGNOSIS — O26891 Other specified pregnancy related conditions, first trimester: Secondary | ICD-10-CM | POA: Diagnosis not present

## 2019-07-03 DIAGNOSIS — R102 Pelvic and perineal pain: Secondary | ICD-10-CM | POA: Insufficient documentation

## 2019-07-03 DIAGNOSIS — O99331 Smoking (tobacco) complicating pregnancy, first trimester: Secondary | ICD-10-CM | POA: Diagnosis not present

## 2019-07-03 DIAGNOSIS — Z3A01 Less than 8 weeks gestation of pregnancy: Secondary | ICD-10-CM | POA: Insufficient documentation

## 2019-07-03 DIAGNOSIS — Z79899 Other long term (current) drug therapy: Secondary | ICD-10-CM | POA: Insufficient documentation

## 2019-07-03 DIAGNOSIS — O99321 Drug use complicating pregnancy, first trimester: Secondary | ICD-10-CM | POA: Diagnosis not present

## 2019-07-03 DIAGNOSIS — F17210 Nicotine dependence, cigarettes, uncomplicated: Secondary | ICD-10-CM | POA: Insufficient documentation

## 2019-07-03 DIAGNOSIS — R1031 Right lower quadrant pain: Secondary | ICD-10-CM

## 2019-07-03 DIAGNOSIS — F1210 Cannabis abuse, uncomplicated: Secondary | ICD-10-CM | POA: Diagnosis not present

## 2019-07-03 DIAGNOSIS — R1030 Lower abdominal pain, unspecified: Secondary | ICD-10-CM | POA: Insufficient documentation

## 2019-07-03 LAB — CBC
HCT: 40.9 % (ref 36.0–46.0)
Hemoglobin: 13.7 g/dL (ref 12.0–15.0)
MCH: 29.8 pg (ref 26.0–34.0)
MCHC: 33.5 g/dL (ref 30.0–36.0)
MCV: 88.9 fL (ref 80.0–100.0)
Platelets: 175 10*3/uL (ref 150–400)
RBC: 4.6 MIL/uL (ref 3.87–5.11)
RDW: 12.7 % (ref 11.5–15.5)
WBC: 9.9 10*3/uL (ref 4.0–10.5)
nRBC: 0 % (ref 0.0–0.2)

## 2019-07-03 LAB — URINALYSIS, ROUTINE W REFLEX MICROSCOPIC
Bilirubin Urine: NEGATIVE
Glucose, UA: NEGATIVE mg/dL
Hgb urine dipstick: NEGATIVE
Ketones, ur: NEGATIVE mg/dL
Leukocytes,Ua: NEGATIVE
Nitrite: NEGATIVE
Protein, ur: NEGATIVE mg/dL
Specific Gravity, Urine: 1.02 (ref 1.005–1.030)
pH: 8 (ref 5.0–8.0)

## 2019-07-03 LAB — COMPREHENSIVE METABOLIC PANEL
ALT: 22 U/L (ref 0–44)
AST: 16 U/L (ref 15–41)
Albumin: 4.6 g/dL (ref 3.5–5.0)
Alkaline Phosphatase: 76 U/L (ref 38–126)
Anion gap: 8 (ref 5–15)
BUN: 17 mg/dL (ref 6–20)
CO2: 26 mmol/L (ref 22–32)
Calcium: 9.3 mg/dL (ref 8.9–10.3)
Chloride: 105 mmol/L (ref 98–111)
Creatinine, Ser: 0.81 mg/dL (ref 0.44–1.00)
GFR calc Af Amer: >60 mL/min (ref >60)
GFR calc non Af Amer: >60 mL/min (ref >60)
Glucose, Bld: 104 mg/dL — ABNORMAL HIGH (ref 70–99)
Potassium: 4.2 mmol/L (ref 3.5–5.1)
Sodium: 139 mmol/L (ref 135–145)
Total Bilirubin: 0.5 mg/dL (ref 0.3–1.2)
Total Protein: 8 g/dL (ref 6.5–8.1)

## 2019-07-03 LAB — WET PREP, GENITAL
Clue Cells Wet Prep HPF POC: NONE SEEN
Sperm: NONE SEEN
Trich, Wet Prep: NONE SEEN
WBC, Wet Prep HPF POC: NONE SEEN
Yeast Wet Prep HPF POC: NONE SEEN

## 2019-07-03 LAB — I-STAT BETA HCG BLOOD, ED (MC, WL, AP ONLY): I-stat hCG, quantitative: 77.4 m[IU]/mL — ABNORMAL HIGH (ref <5)

## 2019-07-03 LAB — HCG, QUANTITATIVE, PREGNANCY: hCG, Beta Chain, Quant, S: 82 m[IU]/mL — ABNORMAL HIGH (ref <5)

## 2019-07-03 LAB — LIPASE, BLOOD: Lipase: 39 U/L (ref 11–51)

## 2019-07-03 NOTE — ED Triage Notes (Signed)
RLQ abdominal pain for 1 week. Sts she used to have ovarian cysts but they were removed.

## 2019-07-04 LAB — GC/CHLAMYDIA PROBE AMP (~~LOC~~) NOT AT ARMC
Chlamydia: NEGATIVE
Comment: NEGATIVE
Comment: NORMAL
Neisseria Gonorrhea: NEGATIVE

## 2019-07-04 NOTE — Discharge Instructions (Addendum)
Please call your gynecology office tomorrow morning to get close follow-up appointment, ideally to be seen in 24 to 48 hours.  You need your beta-hCG level rechecked in approximately 48 hours.  Additionally you should have a repeat ultrasound in around 14 days to evaluate for fetal viability.  Should you have any worsening of your pain, develop any vaginal bleeding, return to ER for reassessment.

## 2019-07-04 NOTE — ED Provider Notes (Signed)
[facility] DEPT Provider Note   CSN: [id number] Arrival date & time: 07/03/19  1925     History Chief Complaint  Patient presents with  . Abdominal Pain    [name] is a 29 y.o. female.  Presents to emergency room with chief complaint of lower abdominal pain.  Patient states that for the past week has had intermittent low right-sided and left-sided pain, seems to be somewhat worse on the right side, comes and goes in waves, mild to moderate in severity, sharp, stabbing sensation.  Low/pelvic region.  No vaginal discharge, no vaginal bleeding, no dysuria or hematuria.  Last menstrual period was April 20.  Has had some recent unprotected sex.  No prior history of ectopic pregnancy, has had prior C-section.  Cholecystectomy, no prior history of appendectomy.  HPI     Past Medical History:  Diagnosis Date  . No pertinent past medical history     Patient Active Problem List   Diagnosis Date Noted  . Indication for care in labor and delivery, antepartum 12/23/2016  . VBAC (vaginal birth after Cesarean) 12/23/2016  . Indication for care in labor or delivery 09/05/2015  . VBAC, delivered, current hospitalization 09/05/2015    Past Surgical History:  Procedure Laterality Date  . 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 CHOLECYSTECTOMY    . NO PAST SURGERIES    . 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 Down syndrome Other        maternal half-nephew  . Diabetes Mother   . Diabetes Maternal Grandmother   . Diabetes Paternal  Grandmother   . Other Neg Hx     Social History   Tobacco Use  . Smoking status: Current Every Day Smoker    Packs/day: 0.25    Types: Cigarettes  . Smokeless tobacco: Former Systems developer    Quit date: 12/11/2014  Substance Use Topics  . Alcohol use: Yes    Comment: occ  . Drug use: Yes    Types: Marijuana    Comment: occ    Home Medications Prior to Admission medications   Medication Sig Start Date End Date Taking? Authorizing Provider  miconazole (MONISTAT 1 COMBINATION PACK) kit Place 1 each vaginally once.   Yes [provider]  ibuprofen (ADVIL,MOTRIN) 600 MG tablet Take 1 tablet (600 mg total) every 6 (six) hours as needed by mouth. Patient not taking: Reported on 07/03/2019 12/25/16   Bovard-Stuckert, Jeral Fruit, MD  Prenatal Vit-Fe Fumarate-FA (MULTIVITAMIN-PRENATAL) 27-0.8 MG TABS tablet Take 1 tablet daily at 12 noon by mouth. Patient not taking: Reported on 07/03/2019 12/25/16   Janyth Contes, MD    Allergies    Patient has no known allergies.  Review of Systems   Review of Systems  Constitutional:  Negative for chills and fever.  HENT: Negative for ear pain and sore throat.   Eyes: Negative for pain and visual disturbance.  Respiratory: Negative for cough and shortness of breath.   Cardiovascular: Negative for chest pain and palpitations.  Gastrointestinal: Positive for abdominal pain. Negative for vomiting.  Genitourinary: Negative for dysuria and hematuria.  Musculoskeletal: Negative for arthralgias and back pain.  Skin: Negative for color change and rash.  Neurological: Negative for seizures and syncope.  All other systems reviewed and are negative.   Physical Exam Updated Vital Signs BP 103/79   Pulse 63   Temp 98.3 F (36.8 C) (Oral)   Resp 15   Ht _0  (1.753 m)   Wt 99.8 kg   SpO2 98%   BMI 32.49 kg/m   Physical Exam Vitals and nursing note reviewed. Exam conducted with a chaperone present.  Constitutional:      General: She is not in  acute distress.    Appearance: She is well-developed.  HENT:     Head: Normocephalic and atraumatic.  Eyes:     Conjunctiva/sclera: Conjunctivae normal.  Cardiovascular:     Rate and Rhythm: Normal rate and regular rhythm.     Heart sounds: No murmur.  Pulmonary:     Effort: Pulmonary effort is normal. No respiratory distress.     Breath sounds: Normal breath sounds.  Abdominal:     Palpations: Abdomen is soft.     Tenderness: There is no abdominal tenderness.     Comments: No focal abdominal pain, there is some lower suprapubic tenderness to palpation worse on right  Genitourinary:    Vagina: Normal.     Comments: Small white discharge noted in vaginal vault, cervix appears normal, there is no adnexal tenderness or fullness, no CMT, chaperone present Musculoskeletal:     Cervical back: Neck supple.  Skin:    General: Skin is warm and dry.  Neurological:     Mental Status: She is alert.     ED Results / Procedures / Treatments   Labs (all labs ordered are listed, but only abnormal results are displayed) Labs Reviewed  COMPREHENSIVE METABOLIC PANEL - Abnormal; Notable for the following components:      Result Value   Glucose, Bld 104 (*)    All other components within normal limits  HCG, QUANTITATIVE, PREGNANCY - Abnormal; Notable for the following components:   hCG, Beta Chain, Quant, S 82 (*)    All other components within normal limits  I-STAT BETA HCG BLOOD, ED ([facility], [facility], [facility] ONLY) - Abnormal; Notable for the following components:   I-stat hCG, quantitative 77.4 (*)    All other components within normal limits  WET PREP, GENITAL  LIPASE, BLOOD  CBC  URINALYSIS, ROUTINE W REFLEX MICROSCOPIC  GC/CHLAMYDIA PROBE AMP ([place]) NOT AT [facility]    EKG None  Radiology US Transvaginal Non-OB  Result Date: 07/03/2019 CLINICAL DATA:  Right lower quadrant pain EXAM: OBSTETRIC <14 WK [place] AND TRANSVAGINAL OB US DOPPLER ULTRASOUND OF OVARIES TECHNIQUE: Both transabdominal and  transvaginal ultrasound examinations were performed for complete evaluation of the gestation as well as the maternal uterus, adnexal regions, and pelvic cul-de-sac. Transvaginal technique was performed to assess early pregnancy. Color and duplex Doppler ultrasound was utilized to evaluate blood flow to the ovaries. COMPARISON:  None. FINDINGS: Intrauterine gestational sac: Possible single intrauterine gestational sac Yolk sac:  Not Visualized. Embryo:  Not Visualized. MSD: 6.1 mm   5 w   2 d Subchorionic  hemorrhage:  None visualized. Maternal uterus/adnexae: Right ovary contains probable hemorrhagic corpus luteum measuring 2.5 cm. Right ovary measures 4.3 x 3 x 3.2 cm. Left ovary measures 3.6 x 2.4 x 2.3 cm. Small free fluid in the pelvis. Pulsed Doppler evaluation of both ovaries demonstrates normal appearing low-resistance arterial and venous waveforms. IMPRESSION: 1. Possible early intrauterine gestational sac, but no yolk sac, fetal pole, or cardiac activity yet visualized. Recommend follow-up quantitative B-HCG levels and follow-up US in 14 days to assess viability. This recommendation follows SRU consensus guidelines: Diagnostic Criteria for Nonviable Pregnancy Early in the First Trimester. [place] [employer] Med 2013; 024:0973-53. 2. Probable hemorrhagic corpus luteum in the right ovary. Small free fluid in the pelvis. Negative for ovarian torsion Electronically Signed   By: Donavan Foil M.D.   On: 07/03/2019 23:23   US OB LESS THAN 14 WEEKS WITH OB TRANSVAGINAL  Result Date: 07/03/2019 CLINICAL DATA:  Right lower quadrant pain EXAM: OBSTETRIC <14 WK [place] AND TRANSVAGINAL OB US DOPPLER ULTRASOUND OF OVARIES TECHNIQUE: Both transabdominal and transvaginal ultrasound examinations were performed for complete evaluation of the gestation as well as the maternal uterus, adnexal regions, and pelvic cul-de-sac. Transvaginal technique was performed to assess early pregnancy. Color and duplex Doppler ultrasound was utilized  to evaluate blood flow to the ovaries. COMPARISON:  None. FINDINGS: Intrauterine gestational sac: Possible single intrauterine gestational sac Yolk sac:  Not Visualized. Embryo:  Not Visualized. MSD: 6.1 mm   5 w   2 d Subchorionic hemorrhage:  None visualized. Maternal uterus/adnexae: Right ovary contains probable hemorrhagic corpus luteum measuring 2.5 cm. Right ovary measures 4.3 x 3 x 3.2 cm. Left ovary measures 3.6 x 2.4 x 2.3 cm. Small free fluid in the pelvis. Pulsed Doppler evaluation of both ovaries demonstrates normal appearing low-resistance arterial and venous waveforms. IMPRESSION: 1. Possible early intrauterine gestational sac, but no yolk sac, fetal pole, or cardiac activity yet visualized. Recommend follow-up quantitative B-HCG levels and follow-up US in 14 days to assess viability. This recommendation follows SRU consensus guidelines: Diagnostic Criteria for Nonviable Pregnancy Early in the First Trimester. [employer] Med 2013; 299:2426-83. 2. Probable hemorrhagic corpus luteum in the right ovary. Small free fluid in the pelvis. Negative for ovarian torsion Electronically Signed   By: Donavan Foil M.D.   On: 07/03/2019 23:23    Procedures Procedures (including critical care time)  Medications Ordered in ED Medications - No data to display  ED Course  I have reviewed the triage vital signs and the nursing notes.  Pertinent labs & imaging results that were available during my care of the patient were reviewed by me and considered in my medical decision making (see chart for details).  Clinical Course as of Jul 04 47  Mon Jul 03, 2019  2208 Comment 3:        [RD]    Clinical Course User Index [RD] Lucrezia Starch, MD   MDM Rules/Calculators/A&P                      29 year old lady who presented to ER with concern for some low abdominal pain.  Patient is well-appearing, symptoms relatively mild.  Initial lab work was concerning for slightly elevated hCG.  This was confirmed  with an upstairs lab hCG.  Ultrasound completed to further evaluate.  Ultrasound demonstrated likely small intrauterine gestational sac.  Right ovary also noted to have likely hemorrhagic corpus luteum.  Suspect this or pregnancy may be etiology for  patient's symptoms.  Discussed the ultrasound findings with patient and likelihood that she has an intrauterine pregnancy however cannot completely rule out ectopic pregnancy.  No leukocytosis, fever or other GI symptoms, no focal TTP at McBurney's point, have low clinical suspicion for acute appendicitis.  Her hemoglobin is stable, first abdomen is soft.  Believe she is appropriate for discharge and outpatient management at this time.  Gave patient very strict return precautions to return for worsening pain, vaginal bleeding.  Further instructed to call gynecology office worsening tomorrow morning to schedule appointment for recheck beta-hCG, initially instructed need for repeat ultrasound.  Patient is agreeable to this plan.  She has an established OB/GYN patient with Dr. Marvel Plan.    After the discussed management above, the patient was determined to be safe for discharge.  The patient was in agreement with this plan and all questions regarding their care were answered.  ED return precautions were discussed and the patient will return to the ED with any significant worsening of condition.    Final Clinical Impression(s) / ED Diagnoses Final diagnoses:  First trimester pregnancy    Rx / DC Orders ED Discharge Orders    None       Lucrezia Starch, MD 07/04/19 [phone]

## 2019-07-05 ENCOUNTER — Ambulatory Visit (INDEPENDENT_AMBULATORY_CARE_PROVIDER_SITE_OTHER): Payer: Self-pay | Admitting: General Practice

## 2019-07-05 DIAGNOSIS — O3680X0 Pregnancy with inconclusive fetal viability, not applicable or unspecified: Secondary | ICD-10-CM

## 2019-07-05 LAB — BETA HCG QUANT (REF LAB): hCG Quant: 147 m[IU]/mL

## 2019-07-05 NOTE — Progress Notes (Signed)
Patient presents to office today for stat bhcg following up from ER visit on 5/24. Patient reports ongoing pain in RLQ/groin for past week- currently rates pain at a 4-5. Denies bleeding. Discussed bhcg process, results take approximately 2 hours and will be reviewed with a doctor in the office. Also discussed we would then call you with results/updated plan of care. Patient verbalized understanding and provided call back number [phone].  Reviewed results with Dr Rip Harbour who finds appropriate rise in bhcg levels, patient should have follow up ultrasound in 10-14 days. Scheduled 6/10 @ 8am.   Called patient and informed her of results & ultrasound appt. Ectopic precautions reviewed. Patient verbalized understanding.   Koren Bound RN BSN 07/05/19

## 2019-07-06 NOTE — Progress Notes (Signed)
Agree with A & P. 

## 2019-07-20 ENCOUNTER — Ambulatory Visit
Admission: RE | Admit: 2019-07-20 | Discharge: 2019-07-20 | Disposition: A | Discharge status: Home / Self Care | Payer: Medicaid Other | Source: Ambulatory Visit | Attending: Obstetrics and Gynecology | Admitting: Obstetrics and Gynecology

## 2019-07-20 ENCOUNTER — Other Ambulatory Visit: Payer: Self-pay

## 2019-07-20 ENCOUNTER — Ambulatory Visit (INDEPENDENT_AMBULATORY_CARE_PROVIDER_SITE_OTHER): Payer: Self-pay | Admitting: *Deleted

## 2019-07-20 DIAGNOSIS — O3680X0 Pregnancy with inconclusive fetal viability, not applicable or unspecified: Secondary | ICD-10-CM

## 2019-07-20 NOTE — Progress Notes (Signed)
Here for [place] results . Reviewed with Dr. Nehemiah Settle and informed [name] US shows live baby [redacted]w[redacted]d with EDD 03/13/20. Also discussed shows mild to moderate hemorrhage and discussed she may have spotting. Instructed if she has heavy bleeding like a period or severe pain to go to hospital for evaluation. Also discussed low FHR noted which may be due to early gestational age but provider recommends 2 week FHR with our nurse with [place].  I instructed her to start prenatal care. She would like to start prenatal care with [place] and will schedule at check out today. I reviewed meds with her and advised to start prenatal vitamins. She is self pay and will purchase OTC PNV. She voices understanding with plan. Nikash Mortensen,RN

## 2019-07-20 NOTE — Progress Notes (Signed)
Chart reviewed - agree with CMA/RN documentation.  ° °

## 2019-08-03 ENCOUNTER — Ambulatory Visit (INDEPENDENT_AMBULATORY_CARE_PROVIDER_SITE_OTHER): Payer: Self-pay

## 2019-08-03 ENCOUNTER — Ambulatory Visit (INDEPENDENT_AMBULATORY_CARE_PROVIDER_SITE_OTHER): Payer: Self-pay | Admitting: General Practice

## 2019-08-03 ENCOUNTER — Other Ambulatory Visit: Payer: Self-pay

## 2019-08-03 DIAGNOSIS — Z3A08 8 weeks gestation of pregnancy: Secondary | ICD-10-CM

## 2019-08-03 DIAGNOSIS — O3680X0 Pregnancy with inconclusive fetal viability, not applicable or unspecified: Secondary | ICD-10-CM

## 2019-08-03 NOTE — Progress Notes (Signed)
Pt informed that the ultrasound is considered a limited OB ultrasound and is not intended to be a complete ultrasound exam.  Patient also informed that the ultrasound is not being completed with the intent of assessing for fetal or placental anomalies or any pelvic abnormalities.  Explained that the purpose of today's ultrasound is to assess for  viability.  Patient acknowledges the purpose of the exam and the limitations of the study.  Single IUP visualized with FHR 166bpm, CRL 15.22mm. patient will follow up to start OB care.   Koren Bound RN BSN 08/03/19

## 2019-08-07 IMAGING — CT CT HEAD W/O CM
4 series · 17 of 47 positions shown, 19 images · non-contrast
Comparison: None.

CLINICAL DATA: Headache, new, meningitis or encephalitis suspected.

EXAM:
CT HEAD WITHOUT CONTRAST
TECHNIQUE: Contiguous axial images were obtained from the base of the skull
through the vertex without intravenous contrast.

[Series 3: head without · axial · non-contrast · 0.41mm/px · z∈[-55,+65]mm · 7 of 33 slices shown, 9 images]
[im 5/33  brain]
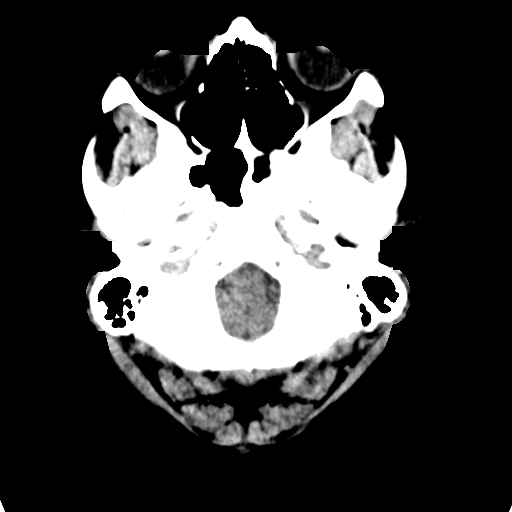
[im 5/33  bone]
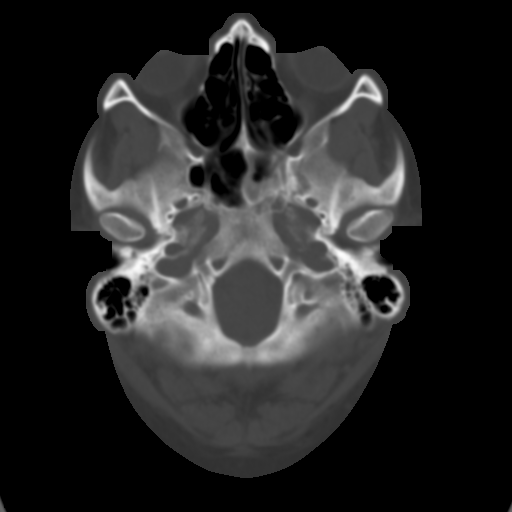
[im 9/33  brain]
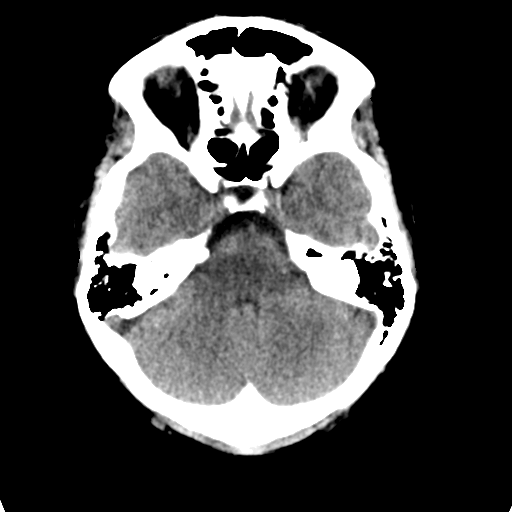
[im 13/33  brain]
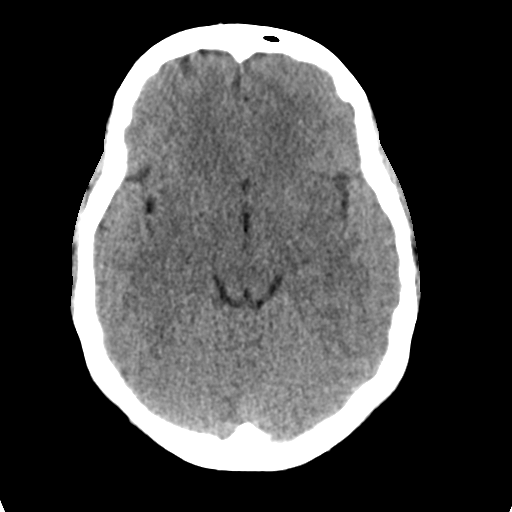
[im 17/33  brain]
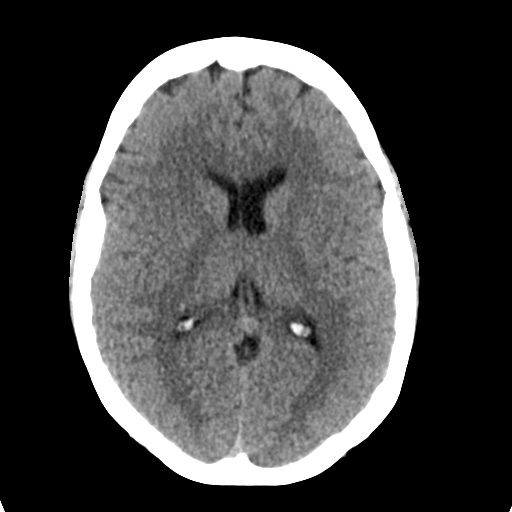
[im 21/33  brain]
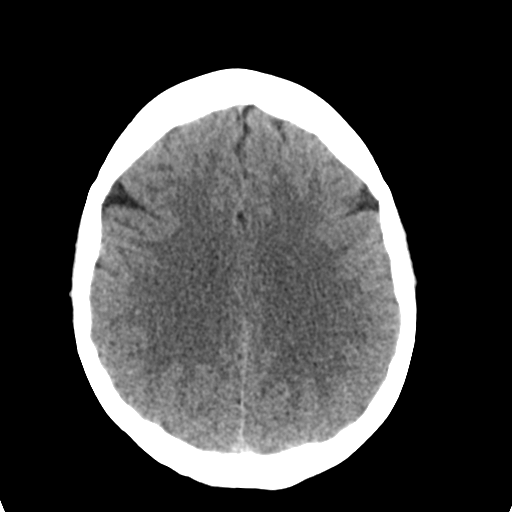
[im 21/33  bone]
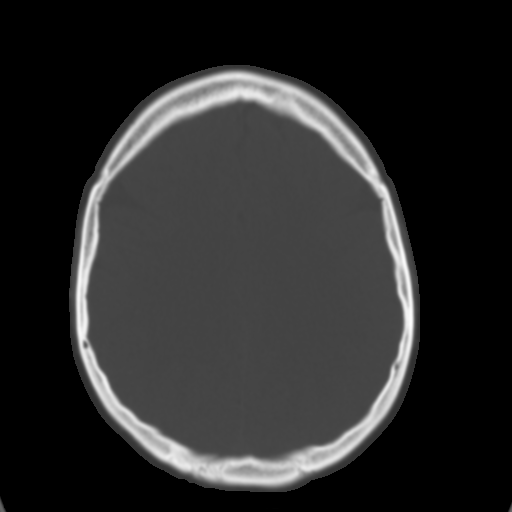
[im 25/33  brain]
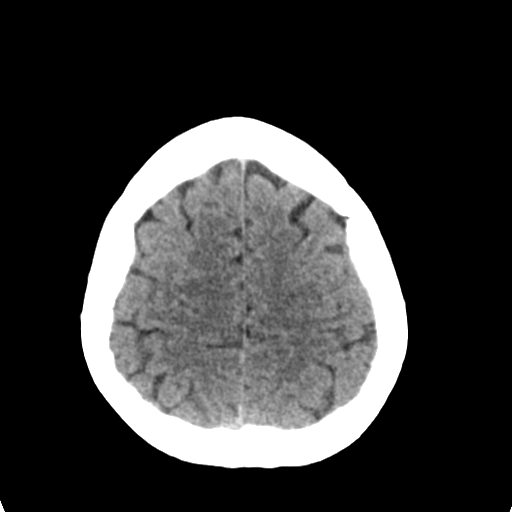
[im 29/33  brain]
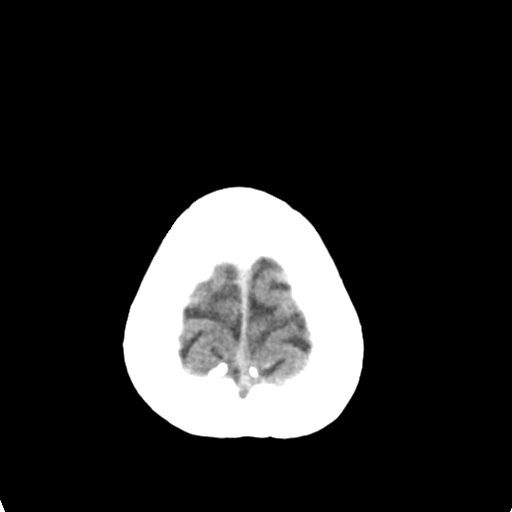

[Series 4: head bone · axial · 0.41mm/px · z∈[-59,-3]mm · 4 of 83 slices shown]
[im 9/83  bone]
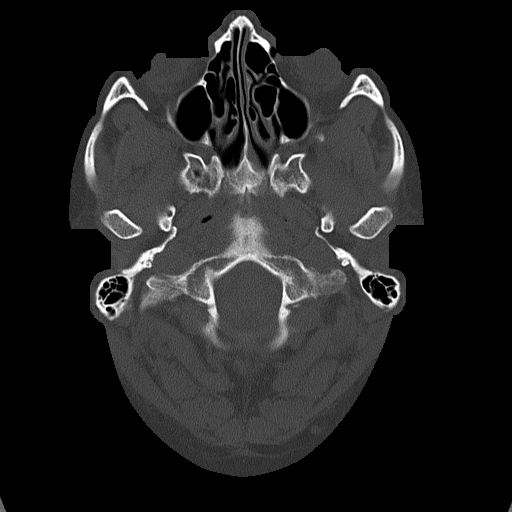
[im 17/83  bone]
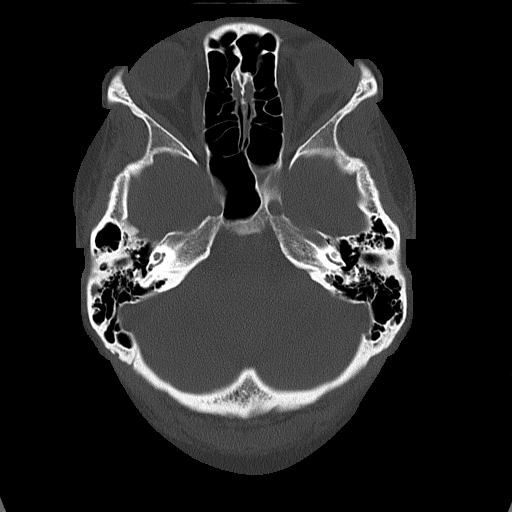
[im 25/83  bone]
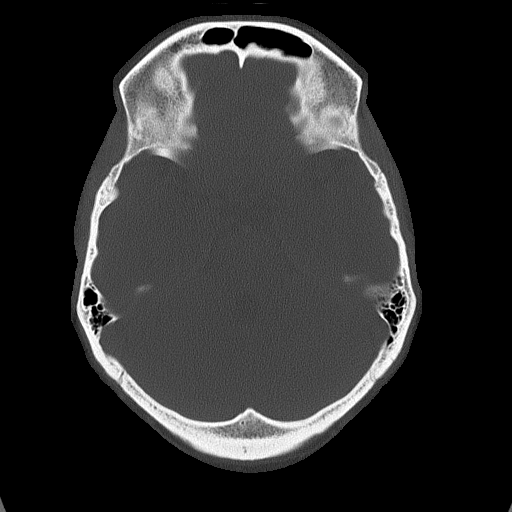
[im 37/83  bone]
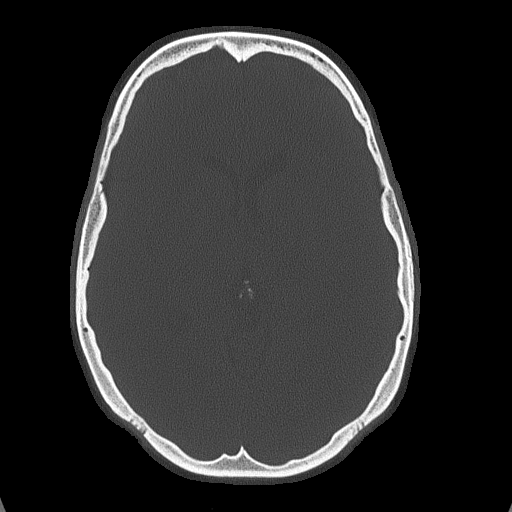

[Series 5: head without cor · coronal · non-contrast · 0.30mm/px · 3 of 65 slices shown]
[im 22/65  brain]
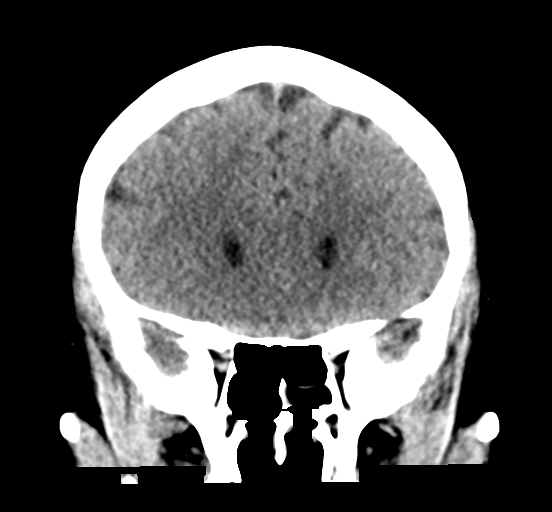
[im 29/65  brain]
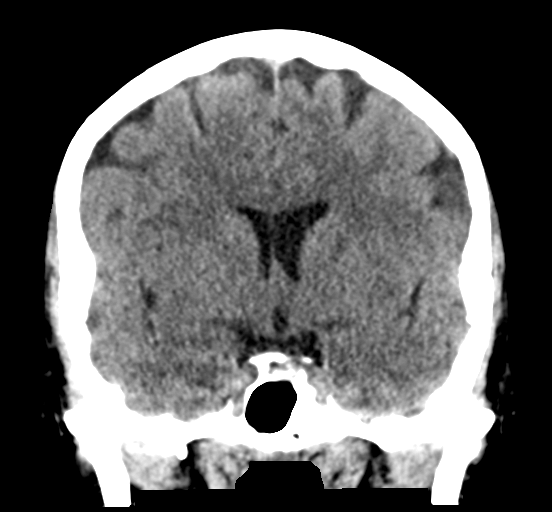
[im 36/65  brain]
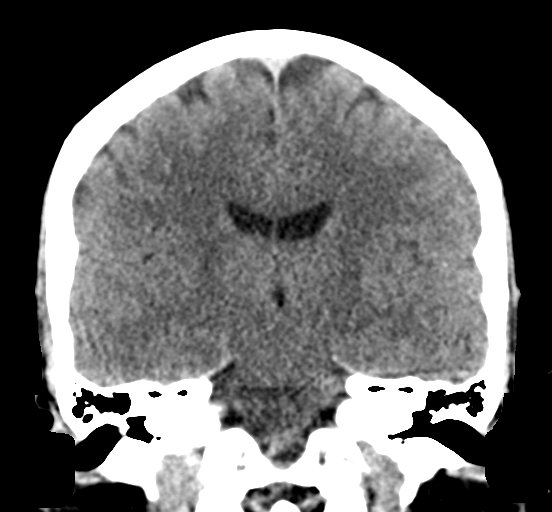

[Series 6: head without sag · sagittal · non-contrast · 0.33mm/px · 3 of 54 slices shown]
[im 18/54  brain]
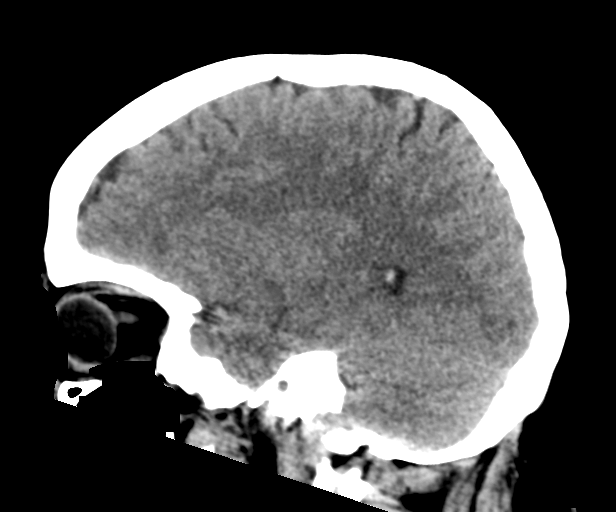
[im 27/54  brain]
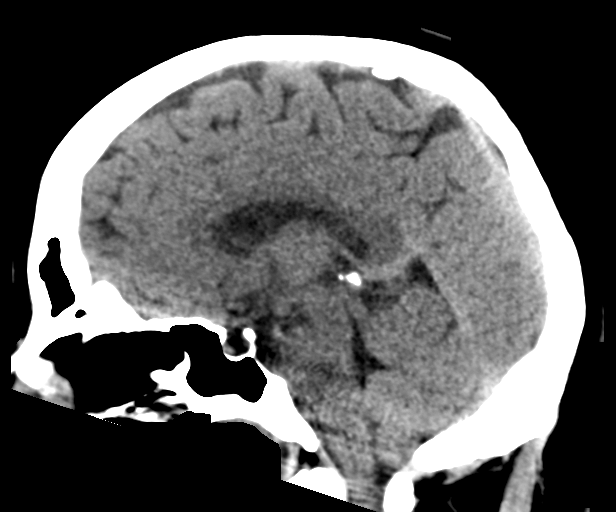
[im 36/54  brain]
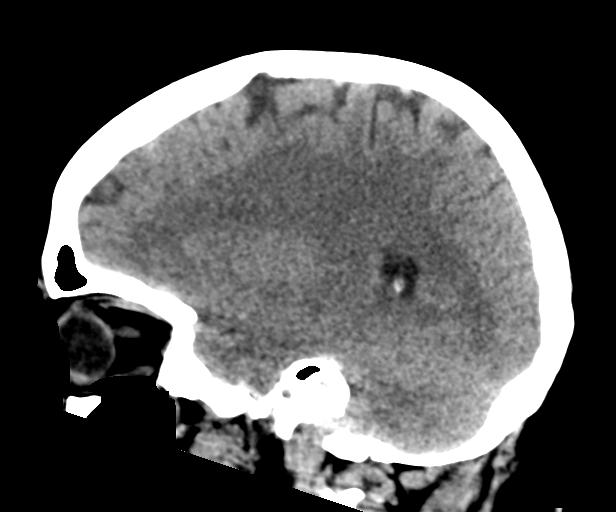

[17 of 47 positions shown; findings below may reference images not displayed]

FINDINGS: Brain: No intracranial hemorrhage, mass effect, or midline shift. No
hydrocephalus. The basilar cisterns are patent. No evidence of
territorial infarct or acute ischemia. No extra-axial or
intracranial fluid collection.

Vascular: No hyperdense vessel or unexpected calcification.

Skull: No fracture or focal lesion.

Sinuses/Orbits: Mucosal thickening with fluid level in left side of
sphenoid sinus. Remaining paranasal sinuses are clear. The mastoid
air cells are well-aerated.

Other: None.
IMPRESSION: 1.  No acute intracranial abnormality.
2. Left sphenoid sinusitis, fluid level suggests an acute component.

## 2019-08-09 ENCOUNTER — Other Ambulatory Visit: Payer: Self-pay

## 2019-08-09 ENCOUNTER — Telehealth (INDEPENDENT_AMBULATORY_CARE_PROVIDER_SITE_OTHER): Payer: Self-pay | Admitting: *Deleted

## 2019-08-09 DIAGNOSIS — Z3490 Encounter for supervision of normal pregnancy, unspecified, unspecified trimester: Secondary | ICD-10-CM | POA: Insufficient documentation

## 2019-08-09 DIAGNOSIS — Z98891 History of uterine scar from previous surgery: Secondary | ICD-10-CM | POA: Insufficient documentation

## 2019-08-09 HISTORY — DX: Encounter for supervision of normal pregnancy, unspecified, unspecified trimester: Z34.90

## 2019-08-09 NOTE — Progress Notes (Signed)
0825 Sakira not available for her virtual visit today. I called her home/mobile number and heard a message "Voicemail box is full and cannot accept any messages." Analina Filla,RN 0830 Damonica still not available virtually for her virtual visit. I called her home/mobile number again and heard a message again " Voicemail is full and cannot accept any messages. "  I called her contact Verdis Frederickson mobile and home numbers and left  messages I am trying to reach [place] and you are listed as a contact. Please give her message to call us regarding her appointment.  Agron Swiney,RN

## 2019-08-23 ENCOUNTER — Ambulatory Visit (INDEPENDENT_AMBULATORY_CARE_PROVIDER_SITE_OTHER): Payer: Self-pay | Admitting: Nurse Practitioner

## 2019-08-23 ENCOUNTER — Other Ambulatory Visit: Payer: Self-pay

## 2019-08-23 ENCOUNTER — Encounter: Payer: Self-pay | Admitting: Nurse Practitioner

## 2019-08-23 ENCOUNTER — Other Ambulatory Visit (HOSPITAL_COMMUNITY)
Admission: RE | Admit: 2019-08-23 | Discharge: 2019-08-23 | Disposition: A | Discharge status: Home / Self Care | Payer: Medicaid Other | Source: Ambulatory Visit | Attending: Nurse Practitioner | Admitting: Nurse Practitioner

## 2019-08-23 VITALS — BP 116/61 | HR 79 | Wt 220.3 lb

## 2019-08-23 DIAGNOSIS — O34219 Maternal care for unspecified type scar from previous cesarean delivery: Secondary | ICD-10-CM

## 2019-08-23 DIAGNOSIS — Z3490 Encounter for supervision of normal pregnancy, unspecified, unspecified trimester: Secondary | ICD-10-CM | POA: Diagnosis present

## 2019-08-23 DIAGNOSIS — Z98891 History of uterine scar from previous surgery: Secondary | ICD-10-CM

## 2019-08-23 DIAGNOSIS — O09291 Supervision of pregnancy with other poor reproductive or obstetric history, first trimester: Secondary | ICD-10-CM

## 2019-08-23 DIAGNOSIS — Z3A11 11 weeks gestation of pregnancy: Secondary | ICD-10-CM

## 2019-08-23 DIAGNOSIS — Z3491 Encounter for supervision of normal pregnancy, unspecified, first trimester: Secondary | ICD-10-CM

## 2019-08-23 DIAGNOSIS — Z87891 Personal history of nicotine dependence: Secondary | ICD-10-CM

## 2019-08-23 LAB — POCT URINALYSIS DIP (DEVICE)
Bilirubin Urine: NEGATIVE
Glucose, UA: NEGATIVE mg/dL
Ketones, ur: NEGATIVE mg/dL
Leukocytes,Ua: NEGATIVE
Nitrite: NEGATIVE
Protein, ur: NEGATIVE mg/dL
Specific Gravity, Urine: 1.025 (ref 1.005–1.030)
Urobilinogen, UA: 2 mg/dL — ABNORMAL HIGH (ref 0.0–1.0)
pH: 7 (ref 5.0–8.0)

## 2019-08-23 NOTE — Progress Notes (Signed)
Subjective:   [name] is a 29 y.o. [id number] at 11w 0d by early ultrasound being seen today for her first obstetrical visit.  Her obstetrical history is significant for former smoker, previous C/S birth, first child deceased, 2 VBAC, use of epidural in previous births. Patient does intend to breast feed. Pregnancy history fully reviewed.  Patient reports no complaints.  HISTORY: OB History  Gravida Para Term Preterm AB Living  4 3 3  0 0 2  SAB TAB Ectopic Multiple Live Births  0 0 0 0 3    # Outcome Date GA Lbr Len/2nd Weight Sex Delivery Anes PTL Lv  4 Current           3 Term 12/23/16 [redacted]w[redacted]d 01:35 / 00:03 7 lb 1.9 oz (3.229 kg) F VBAC EPI  LIV     Name: SANCHEZ,GIRL Dewanna     Apgar1: 6  Apgar5: 9  2 Term 09/05/15 [redacted]w[redacted]d 05:15 / 00:20 7 lb 9.2 oz (3.436 kg) M Vag-Spont EPI  LIV     Name: SANCHEZ,BOY Rosella     Apgar1: 9  Apgar5: 9  1 Term 03/14/12 [redacted]w[redacted]d  6 lb 14.6 oz (3.135 kg) M CS-LTranv EPI  DEC     Name: SANCHEZ,BOY Kerrigan     Apgar1: 9  Apgar5: 9   Past Medical History:  Diagnosis Date  . No pertinent past medical history    Past Surgical History:  Procedure Laterality Date  . 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 CHOLECYSTECTOMY    . NO PAST SURGERIES    . 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 Down syndrome Other        maternal half-nephew  . Diabetes Mother   . Diabetes Maternal Grandmother   . Diabetes Paternal Grandmother   . Other Neg Hx    Social History   Tobacco Use  . Smoking status: Former Smoker    Packs/day: 0.25    Types: Cigarettes  . Smokeless tobacco: Former Systems developer    Quit date: 06/29/2019  Substance Use Topics  . Alcohol use: Yes    Comment: occ  . Drug use: Yes    Types: Marijuana      Comment: occ   No Known Allergies Current Outpatient Medications on File Prior to Visit  Medication Sig Dispense Refill  . Prenatal Vit-Fe Fumarate-FA (MULTIVITAMIN-PRENATAL) 27-0.8 MG TABS tablet Take 1 tablet daily at 12 noon by mouth. 100 tablet 3   No current facility-administered medications on file prior to visit.     Exam   Vitals:   08/23/19 0923  BP: 116/61  Pulse: 79  Weight: 220 lb 4.8 oz (99.9 kg)   Fetal Heart Rate (bpm): 155  Uterus:  Fundal Height: 11 cm  Pelvic Exam: Perineum: no hemorrhoids, normal perineum   Vulva: normal external genitalia, no lesions   Vagina:  normal mucosa, normal discharge   Cervix: Closed, no lesions and normal, pap smear done.    Adnexa: normal adnexa and no mass, fullness, tenderness   Bony Pelvis: average  System: General: well-developed, well-nourished female in no acute distress   Breast:  normal appearance, no masses or tenderness   Skin: normal coloration and turgor,  no rashes   Neurologic: oriented, normal, negative, normal mood   Extremities: normal strength, tone, and muscle mass, ROM of all joints is normal   HEENT extraocular movement intact and sclera clear, anicteric   Mouth/Teeth mucous membranes moist, pharynx normal without lesions and dental hygiene good   Neck supple and no masses, normal thyroid   Cardiovascular: regular rate and rhythm   Respiratory:  no respiratory distress, normal breath sounds   Abdomen: soft, non-tender; no masses,  no organomegaly     Assessment:   Pregnancy: [id number] Patient Active Problem List   Diagnosis Date Noted  . Supervision of low-risk pregnancy 08/09/2019  . History of C-section 08/09/2019     Plan:  1. Encounter for supervision of low-risk pregnancy, antepartum Doing well today.  - [facility] AMB BABYSCRIPTS SCHEDULE OPTIMIZATION - Culture, OB Urine - Genetic Screening - CBC/D/Plt+RPR+Rh+ABO+Rub Ab... - Cytology - PAP( [facility])  2. History of C-section First  pregnancy was C/S and has had 2 VBAC births.  Plans on VBAC for this pregnancy  3. Former smoker Stopped smoking with this pregnancy.  Stopped previously during a pregnancy but started smoking again.  Advised to stay quit after the pregnancy and for the rest of her life.  Initial labs drawn. Continue prenatal vitamins. Genetic Screening discussed, NIPS: ordered. Ultrasound discussed; fetal anatomic survey: will be ordered at next visit. Problem list reviewed and updated. The nature of [facility] with multiple MDs and other Advanced Practice Providers was explained to patient; also emphasized that residents, students are part of our team. Routine obstetric precautions reviewed. Return in about 4 weeks (around 09/20/2019) for in person ROB - needs AFP and with midwife if possible.  Total face-to-face time with patient: 40 minutes.  Over 50% of encounter was spent on counseling and coordination of care.     Earlie Server, FNP Family Nurse Practitioner, [facility] for [employer], [place] 08/23/2019 1:47 PM

## 2019-08-23 NOTE — Patient Instructions (Signed)

## 2019-08-24 LAB — CBC/D/PLT+RPR+RH+ABO+RUB AB...
Antibody Screen: NEGATIVE
Basophils Absolute: 0 10*3/uL (ref 0.0–0.2)
Basos: 0 %
EOS (ABSOLUTE): 0.1 10*3/uL (ref 0.0–0.4)
Eos: 1 %
HCV Ab: <0.1 s/co ratio (ref 0.0–0.9)
HIV Screen 4th Generation wRfx: NONREACTIVE
Hematocrit: 35.2 % (ref 34.0–46.6)
Hemoglobin: 12.6 g/dL (ref 11.1–15.9)
Hepatitis B Surface Ag: NEGATIVE
Immature Grans (Abs): 0.1 10*3/uL (ref 0.0–0.1)
Immature Granulocytes: 1 %
Lymphocytes Absolute: 2.3 10*3/uL (ref 0.7–3.1)
Lymphs: 25 %
MCH: 30.5 pg (ref 26.6–33.0)
MCHC: 35.8 g/dL — ABNORMAL HIGH (ref 31.5–35.7)
MCV: 85 fL (ref 79–97)
Monocytes Absolute: 0.4 10*3/uL (ref 0.1–0.9)
Monocytes: 5 %
Neutrophils Absolute: 6.2 10*3/uL (ref 1.4–7.0)
Neutrophils: 68 %
Platelets: 199 10*3/uL (ref 150–450)
RBC: 4.13 x10E6/uL (ref 3.77–5.28)
RDW: 13.5 % (ref 11.7–15.4)
RPR Ser Ql: NONREACTIVE
Rh Factor: POSITIVE
Rubella Antibodies, IGG: 1.61 index (ref >0.99)
WBC: 9.1 10*3/uL (ref 3.4–10.8)

## 2019-08-24 LAB — HCV INTERPRETATION

## 2019-08-27 LAB — CULTURE, OB URINE

## 2019-08-27 LAB — URINE CULTURE, OB REFLEX

## 2019-08-30 LAB — CYTOLOGY - PAP
Comment: NEGATIVE
Comment: NEGATIVE
Diagnosis: NEGATIVE
HPV 16: NEGATIVE
HPV 18 / 45: NEGATIVE
High risk HPV: POSITIVE — AB

## 2019-09-03 ENCOUNTER — Encounter: Payer: Self-pay | Admitting: Nurse Practitioner

## 2019-09-03 DIAGNOSIS — R87810 Cervical high risk human papillomavirus (HPV) DNA test positive: Secondary | ICD-10-CM | POA: Insufficient documentation

## 2019-09-18 ENCOUNTER — Encounter: Payer: Self-pay | Admitting: *Deleted

## 2019-09-21 ENCOUNTER — Ambulatory Visit (INDEPENDENT_AMBULATORY_CARE_PROVIDER_SITE_OTHER): Payer: Self-pay

## 2019-09-21 ENCOUNTER — Other Ambulatory Visit: Payer: Self-pay

## 2019-09-21 VITALS — BP 103/60 | HR 76 | Wt 218.0 lb

## 2019-09-21 DIAGNOSIS — Z3A15 15 weeks gestation of pregnancy: Secondary | ICD-10-CM

## 2019-09-21 DIAGNOSIS — E669 Obesity, unspecified: Secondary | ICD-10-CM | POA: Insufficient documentation

## 2019-09-21 DIAGNOSIS — Z98891 History of uterine scar from previous surgery: Secondary | ICD-10-CM

## 2019-09-21 DIAGNOSIS — Z3490 Encounter for supervision of normal pregnancy, unspecified, unspecified trimester: Secondary | ICD-10-CM

## 2019-09-21 DIAGNOSIS — O99210 Obesity complicating pregnancy, unspecified trimester: Secondary | ICD-10-CM

## 2019-09-21 MED ORDER — ASPIRIN EC 81 MG PO TBEC
81.0000 mg | DELAYED_RELEASE_TABLET | Freq: Every day | ORAL | 2 refills | Status: DC
Start: 2019-09-21 — End: 2020-03-13

## 2019-09-21 NOTE — Patient Instructions (Signed)
Glucose Tolerance Test During Pregnancy Why am I having this test? The glucose tolerance test (GTT) is done to check how your body processes sugar (glucose). This is one of several tests used to diagnose diabetes that develops during pregnancy (gestational diabetes mellitus). Gestational diabetes is a temporary form of diabetes that some women develop during pregnancy. It usually occurs during the second trimester of pregnancy and goes away after delivery. Testing (screening) for gestational diabetes usually occurs between 24 and 28 weeks of pregnancy. You may have the GTT test after having a 1-hour glucose screening test if the results from that test indicate that you may have gestational diabetes. You may also have this test if:  You have a history of gestational diabetes.  You have a history of giving birth to very large babies or have experienced repeated fetal loss (stillbirth).  You have signs and symptoms of diabetes, such as: ? Changes in your vision. ? Tingling or numbness in your hands or feet. ? Changes in hunger, thirst, and urination that are not otherwise explained by your pregnancy. What is being tested? This test measures the amount of glucose in your blood at different times during a period of 3 hours. This indicates how well your body is able to process glucose. What kind of sample is taken?  Blood samples are required for this test. They are usually collected by inserting a needle into a blood vessel. How do I prepare for this test?  For 3 days before your test, eat normally. Have plenty of carbohydrate-rich foods.  Follow instructions from your health care provider about: ? Eating or drinking restrictions on the day of the test. You may be asked to not eat or drink anything other than water (fast) starting 8-10 hours before the test. ? Changing or stopping your regular medicines. Some medicines may interfere with this test. Tell a health care provider about:  All  medicines you are taking, including vitamins, herbs, eye drops, creams, and over-the-counter medicines.  Any blood disorders you have.  Any surgeries you have had.  Any medical conditions you have. What happens during the test? First, your blood glucose will be measured. This is referred to as your fasting blood glucose, since you fasted before the test. Then, you will drink a glucose solution that contains a certain amount of glucose. Your blood glucose will be measured again 1, 2, and 3 hours after drinking the solution. This test takes about 3 hours to complete. You will need to stay at the testing location during this time. During the testing period:  Do not eat or drink anything other than the glucose solution.  Do not exercise.  Do not use any products that contain nicotine or tobacco, such as cigarettes and e-cigarettes. If you need help stopping, ask your health care provider. The testing procedure may vary among health care providers and hospitals. How are the results reported? Your results will be reported as milligrams of glucose per deciliter of blood (mg/dL) or millimoles per liter (mmol/L). Your health care provider will compare your results to normal ranges that were established after testing a large group of people (reference ranges). Reference ranges may vary among labs and hospitals. For this test, common reference ranges are:  Fasting: less than 95-105 mg/dL (5.3-5.8 mmol/L).  1 hour after drinking glucose: less than 180-190 mg/dL (10.0-10.5 mmol/L).  2 hours after drinking glucose: less than 155-165 mg/dL (8.6-9.2 mmol/L).  3 hours after drinking glucose: 140-145 mg/dL (7.8-8.1 mmol/L). What do the   results mean? Results within reference ranges are considered normal, meaning that your glucose levels are well-controlled. If two or more of your blood glucose levels are high, you may be diagnosed with gestational diabetes. If only one level is high, your health care  provider may suggest repeat testing or other tests to confirm a diagnosis. Talk with your health care provider about what your results mean. Questions to ask your health care provider Ask your health care provider, or the department that is doing the test:  When will my results be ready?  How will I get my results?  What are my treatment options?  What other tests do I need?  What are my next steps? Summary  The glucose tolerance test (GTT) is one of several tests used to diagnose diabetes that develops during pregnancy (gestational diabetes mellitus). Gestational diabetes is a temporary form of diabetes that some women develop during pregnancy.  You may have the GTT test after having a 1-hour glucose screening test if the results from that test indicate that you may have gestational diabetes. You may also have this test if you have any symptoms or risk factors for gestational diabetes.  Talk with your health care provider about what your results mean. This information is not intended to replace advice given to you by your health care provider. Make sure you discuss any questions you have with your health care provider. Document Revised: 05/19/2018 Document Reviewed: 09/07/2016 Elsevier Patient Education  2020 Elsevier Inc.  

## 2019-09-21 NOTE — Progress Notes (Signed)
LOW-RISK PREGNANCY OFFICE VISIT  Patient name: [name] MRN [id number]  Date of birth: 05-28-1990 Chief Complaint:   Routine Prenatal Visit  Subjective:   [name] is a 29 y.o. [phone] female at [redacted]w[redacted]d with an Estimated Date of Delivery: 03/13/20 being seen today for ongoing management of a low-risk pregnancy aeb has Supervision of low-risk pregnancy; History of C-section; Cervical high risk human papillomavirus (HPV) DNA test positive; and Obesity in pregnancy, antepartum on their problem list.  Patient presents today without complaint. Patient denies abdominal cramping. Patient denies vaginal concerns including abnormal discharge, leaking of fluid, and bleeding. She requests that provider review her previous results today.  Contractions: Not present. Vag. Bleeding: None.  Movement: Absent.  Reviewed past medical,surgical, social, obstetrical and family history as well as problem list, medications and allergies.  Objective   Vitals:   09/21/19 1331  BP: 103/60  Pulse: 76  Weight: 218 lb (98.9 kg)  Body mass index is 32.19 kg/m.  Total Weight Gain:3 lb (1.361 kg)         Physical Examination:   General appearance: Well appearing, and in no distress  Mental status: Alert, oriented to person, place, and time  Skin: Warm & dry  Cardiovascular: Normal heart rate noted  Respiratory: Normal respiratory effort, no distress  Abdomen: Soft, gravid, nontender, AGA with Fundal height of    Pelvic: Cervical exam deferred           Extremities: Edema: None  Fetal Status: Fetal Heart Rate (bpm): 140  Movement: Absent   No results found for this or any previous visit (from the past 24 hour(s)).  Assessment & Plan:  Low-risk pregnancy of a 29 y.o., [id number] at [redacted]w[redacted]d with an Estimated Date of Delivery: 03/13/20   1. Encounter for supervision of low-risk pregnancy, antepartum -Anatomy US ordered. -Reviewed results from initial visits. -Extensive discussion regarding HPV,  including what it is, how we monitor, and how to decrease risk. -Reassured that HPV detected was not from 16/18/45 which are known contributors to cervical cancer. -Further reassured that retest in one year is appropriate.   -Also reviewed genetic tests and informed that infant low risk. -Anticipatory guidance for upcoming appts.   2. History of C-section -Plan to have next visit with MD, virtually, to discuss R/B.  -Will have patient sign VBAC consents at next in-person visit.  3. Obesity in pregnancy, antepartum -Discussed risk factors for PreEclampsia due to BMI. -Reviewed research behind aspirin initiation in prenatal period to decrease risk of PreEclampsia in pregnancy. -Patient agreeable and Rx for aspirin sent to pharmacy on file.     Meds:  Meds ordered this encounter  Medications   aspirin EC 81 MG tablet    Sig: Take 1 tablet (81 mg total) by mouth daily.    Dispense:  60 tablet    Refill:  2    Order Specific Question:   Supervising Provider    Answer:   Donnamae Jude [[id number]]   Labs/procedures today:  Lab Orders     AFP, Serum, Open Spina Bifida     Hemoglobin A1c   Reviewed: Preterm labor symptoms and general obstetric precautions including but not limited to vaginal bleeding, contractions, leaking of fluid and fetal movement were reviewed in detail with the patient.  All questions were answered.  Follow-up: Return in about 5 weeks (around 10/26/2019) for Virtual HROB with MD to discuss r/b of VBAC.  Orders Placed This Encounter  Procedures   [place] MFM OB DETAIL +  68 WK   AFP, Serum, Open Spina Bifida   Hemoglobin A1c   [name], [name] 09/21/2019

## 2019-09-23 LAB — AFP, SERUM, OPEN SPINA BIFIDA
AFP MoM: 0.6
AFP Value: 14.5 ng/mL
Gest. Age on Collection Date: 15.1 weeks
Maternal Age At EDD: 30.9 yr
OSBR Risk 1 IN: 10000
Test Results:: NEGATIVE
Weight: 218 [lb_av]

## 2019-09-23 LAB — HEMOGLOBIN A1C
Est. average glucose Bld gHb Est-mCnc: 82 mg/dL
Hgb A1c MFr Bld: 4.5 % — ABNORMAL LOW (ref 4.8–5.6)

## 2019-10-20 ENCOUNTER — Ambulatory Visit: Payer: Medicaid Other

## 2019-10-20 ENCOUNTER — Other Ambulatory Visit: Payer: Self-pay

## 2019-10-20 ENCOUNTER — Other Ambulatory Visit: Payer: Self-pay | Admitting: *Deleted

## 2019-10-20 ENCOUNTER — Encounter: Payer: Self-pay | Admitting: *Deleted

## 2019-10-20 ENCOUNTER — Ambulatory Visit: Payer: Medicaid Other | Admitting: *Deleted

## 2019-10-20 DIAGNOSIS — E669 Obesity, unspecified: Secondary | ICD-10-CM | POA: Diagnosis not present

## 2019-10-20 DIAGNOSIS — Z98891 History of uterine scar from previous surgery: Secondary | ICD-10-CM | POA: Diagnosis present

## 2019-10-20 DIAGNOSIS — O99210 Obesity complicating pregnancy, unspecified trimester: Secondary | ICD-10-CM | POA: Diagnosis present

## 2019-10-20 DIAGNOSIS — O322XX0 Maternal care for transverse and oblique lie, not applicable or unspecified: Secondary | ICD-10-CM | POA: Diagnosis not present

## 2019-10-20 DIAGNOSIS — O34219 Maternal care for unspecified type scar from previous cesarean delivery: Secondary | ICD-10-CM

## 2019-10-20 DIAGNOSIS — Z3490 Encounter for supervision of normal pregnancy, unspecified, unspecified trimester: Secondary | ICD-10-CM | POA: Insufficient documentation

## 2019-10-20 DIAGNOSIS — O99212 Obesity complicating pregnancy, second trimester: Secondary | ICD-10-CM | POA: Diagnosis not present

## 2019-10-20 DIAGNOSIS — Z3A19 19 weeks gestation of pregnancy: Secondary | ICD-10-CM

## 2019-10-20 DIAGNOSIS — Z363 Encounter for antenatal screening for malformations: Secondary | ICD-10-CM

## 2019-10-24 ENCOUNTER — Encounter: Payer: Self-pay | Admitting: General Practice

## 2019-10-26 ENCOUNTER — Other Ambulatory Visit: Payer: Self-pay

## 2019-10-26 ENCOUNTER — Encounter: Payer: Self-pay | Admitting: Obstetrics and Gynecology

## 2019-10-26 ENCOUNTER — Telehealth (INDEPENDENT_AMBULATORY_CARE_PROVIDER_SITE_OTHER): Payer: 59 | Admitting: Obstetrics and Gynecology

## 2019-10-26 DIAGNOSIS — O99212 Obesity complicating pregnancy, second trimester: Secondary | ICD-10-CM | POA: Diagnosis not present

## 2019-10-26 DIAGNOSIS — E669 Obesity, unspecified: Secondary | ICD-10-CM | POA: Diagnosis not present

## 2019-10-26 DIAGNOSIS — O34219 Maternal care for unspecified type scar from previous cesarean delivery: Secondary | ICD-10-CM | POA: Diagnosis not present

## 2019-10-26 DIAGNOSIS — Z3A20 20 weeks gestation of pregnancy: Secondary | ICD-10-CM

## 2019-10-26 DIAGNOSIS — Z3492 Encounter for supervision of normal pregnancy, unspecified, second trimester: Secondary | ICD-10-CM

## 2019-10-26 DIAGNOSIS — O99210 Obesity complicating pregnancy, unspecified trimester: Secondary | ICD-10-CM

## 2019-10-26 DIAGNOSIS — Z98891 History of uterine scar from previous surgery: Secondary | ICD-10-CM

## 2019-10-26 NOTE — Progress Notes (Signed)
I connected with  [name] on 10/26/19 at  2:35 PM EDT by telephone and verified that I am speaking with the correct person using two identifiers.   I discussed the limitations, risks, security and privacy concerns of performing an evaluation and management service by telephone and the availability of in person appointments. I also discussed with the patient that there may be a patient responsible charge related to this service. The patient expressed understanding and agreed to proceed.  Verdell Carmine, RN 10/26/2019  2:34 PM

## 2019-10-26 NOTE — Progress Notes (Signed)
OBSTETRICS PRENATAL VIRTUAL VISIT ENCOUNTER NOTE  Provider location: [facility]   I connected with [name] on 10/26/19 at  2:35 PM EDT by MyChart Video Encounter at home and verified that I am speaking with the correct person using two identifiers.   I discussed the limitations, risks, security and privacy concerns of performing an evaluation and management service virtually and the availability of in person appointments. I also discussed with the patient that there may be a patient responsible charge related to this service. The patient expressed understanding and agreed to proceed. Subjective:  [name] is a 29 y.o. [id number] at [redacted]w[redacted]d being seen today for ongoing prenatal care.  She is currently monitored for the following issues for this high-risk pregnancy and has Supervision of low-risk pregnancy; History of C-section; Cervical high risk human papillomavirus (HPV) DNA test positive; and Obesity in pregnancy, antepartum on their problem list.  Patient reports no complaints.  Contractions: Not present. Vag. Bleeding: None.  Movement: Present. Denies any leaking of fluid.   The following portions of the patient's history were reviewed and updated as appropriate: allergies, current medications, past family history, past medical history, past social history, past surgical history and problem list.   Objective:  There were no vitals filed for this visit.  Fetal Status:     Movement: Present     General:  Alert, oriented and cooperative. Patient is in no acute distress.  Respiratory: Normal respiratory effort, no problems with respiration noted  Mental Status: Normal mood and affect. Normal behavior. Normal judgment and thought content.  Rest of physical exam deferred due to type of encounter  Imaging: [place] MFM OB DETAIL +14 WK  Result Date: 10/21/2019 ----------------------------------------------------------------------  OBSTETRICS  REPORT                        (Signed Final 10/21/2019 12:39 pm) ---------------------------------------------------------------------- Patient Info  ID #:       [id number]                          D.O.B.:  12-30-1990 (29 yrs)  Name:       [name]                Visit Date: 10/20/2019 12:54 pm ---------------------------------------------------------------------- Performed By  Attending:        Sander Nephew      Ref. Address:      [address]                    [address]                                                              [address]                                                              [place] [address]  [place]  Performed By:     Georgie Chard        [facility]                                      [facility]                                                              [facility]                                                              [facility]  Referred By:      Truett Mainland                    MD ---------------------------------------------------------------------- Orders  #  Description                           Code        Ordered By  1  [place] MFM OB DETAIL +14 WK               76811.01    JESSICA EMLY ----------------------------------------------------------------------  #  Order #                     Accession #                Episode #  1  [id number]                   [id number]                 [id number] ---------------------------------------------------------------------- Indications  [redacted] weeks gestation of pregnancy                 Z3A.19  Encounter for antenatal screening for           Z36.3  malformations  History of cesarean delivery, currently         O34.219  pregnant  Obesity complicating pregnancy, second          O99.212  trimester ---------------------------------------------------------------------- Fetal Evaluation  Num Of Fetuses:          1  Fetal Heart Rate(bpm):    152  Cardiac Activity:        Observed  Presentation:            Transverse, head to maternal left  Placenta:                Posterior  P. Cord Insertion:       Not well visualized  Amniotic Fluid  AFI FV:      Within normal limits  Largest Pocket(cm)                              4.51 ---------------------------------------------------------------------- Biometry  BPD:      39.3  mm     G. Age:  18w 0d          6  %    CI:        64.27   %    70 - 86                                                          FL/HC:       18.1  %    16.1 - 18.3  HC:      157.7  mm     G. Age:  18w 5d         16  %    HC/AC:       1.09       1.09 - 1.39  AC:      144.5  mm     G. Age:  19w 5d         62  %    FL/BPD:      72.8  %  FL:       28.6  mm     G. Age:  18w 5d         25  %    FL/AC:       19.8  %    20 - 24  HUM:      28.5  mm     G. Age:  19w 1d         49  %  CER:      20.1  mm     G. Age:  19w 3d         77  %  NFT:       2.7  mm  LV:        3.5  mm  CM:        4.8  mm  Est. FW:     279   gm    0 lb 10 oz     40  % ---------------------------------------------------------------------- OB History  Gravidity:    4         Term:   3  Living:       3 ---------------------------------------------------------------------- Gestational Age  U/S Today:     18w 6d                                        EDD:   03/16/20  Best:          19w 2d     Det. By:  Previous Ultrasound      EDD:   03/13/20                                      (07/20/19) ---------------------------------------------------------------------- Anatomy  Cranium:               Appears normal  Aortic Arch:            Appears normal  Cavum:                 Appears normal         Ductal Arch:            Not well visualized  Ventricles:            Appears normal         Diaphragm:              Appears normal  Choroid Plexus:        Appears normal         Stomach:                Appears normal, left                                                                         sided  Cerebellum:            Appears normal         Abdomen:                Appears normal  Posterior Fossa:       Appears normal         Abdominal Wall:         Appears nml (cord                                                                        insert, abd wall)  Nuchal Fold:           Appears normal         Cord Vessels:           Appears normal (3                                                                        vessel cord)  Face:                  Orbits nl; profile not Kidneys:                Appear normal                         well visualized  Lips:                  Not well visualized    Bladder:                Appears normal  Thoracic:              Appears normal  Spine:                  Appears normal  Heart:                 Not well visualized    Upper Extremities:      Appears normal  RVOT:                  Appears normal         Lower Extremities:      Appears normal  LVOT:                  Not well visualized  Other:  Technically difficult due to fetal position. ---------------------------------------------------------------------- Cervix Uterus Adnexa  Cervix  Length:            3.4  cm.  Normal appearance by transabdominal scan. ---------------------------------------------------------------------- Impression  Single intrauterine pregnancy here for a detailed anatomy.  Normal anatomy with measurements consistent with dates  There is good fetal movement and amniotic fluid volume  Suboptimal views of the fetal anatomy were obtained  secondary to fetal position. ---------------------------------------------------------------------- Recommendations  Follow up growth in 4 weeks. ----------------------------------------------------------------------               Sander Nephew, MD Electronically Signed Final Report   10/21/2019 12:39 pm ----------------------------------------------------------------------   Assessment and Plan:  Pregnancy: [id number]  at [redacted]w[redacted]d 1. History of C-section Followed by 2 VBAC. Patient desires another TOLAC  2. Encounter for supervision of low-risk pregnancy in second trimester Patient is doing well without complaints  3. Obesity in pregnancy, antepartum Patient is not taking ASA  Preterm labor symptoms and general obstetric precautions including but not limited to vaginal bleeding, contractions, leaking of fluid and fetal movement were reviewed in detail with the patient. I discussed the assessment and treatment plan with the patient. The patient was provided an opportunity to ask questions and all were answered. The patient agreed with the plan and demonstrated an understanding of the instructions. The patient was advised to call back or seek an in-person office evaluation/go to [facility] at [facility] for any urgent or concerning symptoms. Please refer to After Visit Summary for other counseling recommendations.   I provided 15 minutes of face-to-face time during this encounter.  No follow-ups on file.  Future Appointments  Date Time Provider [place]  11/17/2019 12:45 PM [facility] NURSE [facility]  11/17/2019  1:00 PM [facility] US1 [facility] [place]    Mora Bellman, MD [facility]

## 2019-11-17 ENCOUNTER — Ambulatory Visit: Payer: Medicaid Other | Admitting: *Deleted

## 2019-11-17 ENCOUNTER — Ambulatory Visit: Payer: Medicaid Other | Attending: Obstetrics and Gynecology

## 2019-11-17 ENCOUNTER — Other Ambulatory Visit: Payer: Self-pay

## 2019-11-17 DIAGNOSIS — O99212 Obesity complicating pregnancy, second trimester: Secondary | ICD-10-CM

## 2019-11-17 DIAGNOSIS — Z3A23 23 weeks gestation of pregnancy: Secondary | ICD-10-CM

## 2019-11-17 DIAGNOSIS — Z98891 History of uterine scar from previous surgery: Secondary | ICD-10-CM | POA: Diagnosis present

## 2019-11-17 DIAGNOSIS — Z362 Encounter for other antenatal screening follow-up: Secondary | ICD-10-CM | POA: Diagnosis not present

## 2019-11-17 DIAGNOSIS — O34219 Maternal care for unspecified type scar from previous cesarean delivery: Secondary | ICD-10-CM

## 2019-11-17 DIAGNOSIS — O99210 Obesity complicating pregnancy, unspecified trimester: Secondary | ICD-10-CM | POA: Diagnosis not present

## 2019-11-23 ENCOUNTER — Telehealth (INDEPENDENT_AMBULATORY_CARE_PROVIDER_SITE_OTHER): Payer: Medicaid Other | Admitting: Student

## 2019-11-23 DIAGNOSIS — Z3492 Encounter for supervision of normal pregnancy, unspecified, second trimester: Secondary | ICD-10-CM

## 2019-11-23 DIAGNOSIS — Z3A24 24 weeks gestation of pregnancy: Secondary | ICD-10-CM

## 2019-11-23 NOTE — Progress Notes (Signed)
Patient ID: [name], female   DOB: August 23, 1990, 29 y.o.   MRN: [id number]   I connected with Angelmarie Ponzo 11/23/19 at  3:55 PM EDT by: MyChart video and verified that I am speaking with the correct person using two identifiers.  Patient is located at work in her car and provider is located at [facility].    The purpose of this virtual visit is to provide medical care while limiting exposure to the novel coronavirus. I discussed the limitations, risks, security and privacy concerns of performing an evaluation and management service by MyChart video and the availability of in person appointments. I also discussed with the patient that there may be a patient responsible charge related to this service. By engaging in this virtual visit, you consent to the provision of healthcare.  Additionally, you authorize for your insurance to be billed for the services provided during this visit.  The patient expressed understanding and agreed to proceed.  The following staff members participated in the virtual visit:  Carver Fila    PRENATAL VISIT NOTE  Subjective:  Shellby Schlink is a 29 y.o. [id number] at [redacted]w[redacted]d  for phone visit for ongoing prenatal care.  She is currently monitored for the following issues for this low-risk pregnancy and has Supervision of low-risk pregnancy; History of C-section; Cervical high risk human papillomavirus (HPV) DNA test positive; and Obesity in pregnancy, antepartum on their problem list.  Patient reports some itching on her labia when she sleeps, near her urethra. Thinks it could be related to her sweating at night. . Patient has been checking BP at home, reports normal values at home. Still interested in VBAC.  Contractions: Not present. Vag. Bleeding: None.  Movement: Present. Denies leaking of fluid.   The following portions of the patient's history were reviewed and updated as appropriate: allergies, current medications, past family history, past medical history, past social  history, past surgical history and problem list.   Objective:  There were no vitals filed for this visit. Self-Obtained  Fetal Status:     Movement: Present     Assessment and Plan:  Pregnancy: [id number] at [redacted]w[redacted]d  1. Encounter for supervision of low-risk pregnancy in second trimester   -Discussed VBAC, patient wants to sign consent at next visit.  -Discussed the upcoming steps to 2 hour GTT -Will try lubricant to see if that helps, recommend sleeping without underwear on to minimize heat.  -Patient will check BP at home, reviewed warning values and what to do if elevated.  Preterm labor symptoms and general obstetric precautions including but not limited to vaginal bleeding, contractions, leaking of fluid and fetal movement were reviewed in detail with the patient.  Return in about 4 weeks (around 12/21/2019), or LROB in person with KK and 2 hour gtt.  No future appointments.   Time spent on virtual visit: 20 minutes  [place]arr [place], [name]

## 2019-11-23 NOTE — Patient Instructions (Addendum)
-check blood pressure weekly!! Values should be less than 140/90.    Glucose Tolerance Test During Pregnancy Why am I having this test? The glucose tolerance test (GTT) is done to check how your body processes sugar (glucose). This is one of several tests used to diagnose diabetes that develops during pregnancy (gestational diabetes mellitus). Gestational diabetes is a temporary form of diabetes that some women develop during pregnancy. It usually occurs during the second trimester of pregnancy and goes away after delivery. Testing (screening) for gestational diabetes usually occurs between 24 and 28 weeks of pregnancy. You may have the GTT test after having a 1-hour glucose screening test if the results from that test indicate that you may have gestational diabetes. You may also have this test if:  You have a history of gestational diabetes.  You have a history of giving birth to very large babies or have experienced repeated fetal loss (stillbirth).  You have signs and symptoms of diabetes, such as: ? Changes in your vision. ? Tingling or numbness in your hands or feet. ? Changes in hunger, thirst, and urination that are not otherwise explained by your pregnancy. What is being tested? This test measures the amount of glucose in your blood at different times during a period of 3 hours. This indicates how well your body is able to process glucose. What kind of sample is taken?  Blood samples are required for this test. They are usually collected by inserting a needle into a blood vessel. How do I prepare for this test?  For 3 days before your test, eat normally. Have plenty of carbohydrate-rich foods.  Follow instructions from your health care provider about: ? Eating or drinking restrictions on the day of the test. You may be asked to not eat or drink anything other than water (fast) starting 8-10 hours before the test. ? Changing or stopping your regular medicines. Some medicines may  interfere with this test. Tell a health care provider about:  All medicines you are taking, including vitamins, herbs, eye drops, creams, and over-the-counter medicines.  Any blood disorders you have.  Any surgeries you have had.  Any medical conditions you have. What happens during the test? First, your blood glucose will be measured. This is referred to as your fasting blood glucose, since you fasted before the test. Then, you will drink a glucose solution that contains a certain amount of glucose. Your blood glucose will be measured again 1, 2, and 3 hours after drinking the solution. This test takes about 3 hours to complete. You will need to stay at the testing location during this time. During the testing period:  Do not eat or drink anything other than the glucose solution.  Do not exercise.  Do not use any products that contain nicotine or tobacco, such as cigarettes and e-cigarettes. If you need help stopping, ask your health care provider. The testing procedure may vary among health care providers and hospitals. How are the results reported? Your results will be reported as milligrams of glucose per deciliter of blood (mg/dL) or millimoles per liter (mmol/L). Your health care provider will compare your results to normal ranges that were established after testing a large group of people (reference ranges). Reference ranges may vary among labs and hospitals. For this test, common reference ranges are:  Fasting: less than 95-105 mg/dL (5.3-5.8 mmol/L).  1 hour after drinking glucose: less than 180-190 mg/dL (10.0-10.5 mmol/L).  2 hours after drinking glucose: less than 155-165 mg/dL (8.6-9.2 mmol/L).  3 hours after drinking glucose: 140-145 mg/dL (7.8-8.1 mmol/L). What do the results mean? Results within reference ranges are considered normal, meaning that your glucose levels are well-controlled. If two or more of your blood glucose levels are high, you may be diagnosed with  gestational diabetes. If only one level is high, your health care provider may suggest repeat testing or other tests to confirm a diagnosis. Talk with your health care provider about what your results mean. Questions to ask your health care provider Ask your health care provider, or the department that is doing the test:  When will my results be ready?  How will I get my results?  What are my treatment options?  What other tests do I need?  What are my next steps? Summary  The glucose tolerance test (GTT) is one of several tests used to diagnose diabetes that develops during pregnancy (gestational diabetes mellitus). Gestational diabetes is a temporary form of diabetes that some women develop during pregnancy.  You may have the GTT test after having a 1-hour glucose screening test if the results from that test indicate that you may have gestational diabetes. You may also have this test if you have any symptoms or risk factors for gestational diabetes.  Talk with your health care provider about what your results mean. This information is not intended to replace advice given to you by your health care provider. Make sure you discuss any questions you have with your health care provider. Document Revised: 05/19/2018 Document Reviewed: 09/07/2016 Elsevier Patient Education  [employer].

## 2019-11-23 NOTE — Progress Notes (Signed)
Pt was not able to take BP, asked if she can take later & record in My Chart or BRx. Pt verbalized understanding.

## 2019-12-19 ENCOUNTER — Other Ambulatory Visit: Payer: Self-pay

## 2019-12-19 DIAGNOSIS — Z3493 Encounter for supervision of normal pregnancy, unspecified, third trimester: Secondary | ICD-10-CM

## 2019-12-22 ENCOUNTER — Other Ambulatory Visit: Payer: Medicaid Other

## 2019-12-22 ENCOUNTER — Other Ambulatory Visit: Payer: Self-pay

## 2019-12-22 ENCOUNTER — Ambulatory Visit (INDEPENDENT_AMBULATORY_CARE_PROVIDER_SITE_OTHER): Payer: 59 | Admitting: Obstetrics and Gynecology

## 2019-12-22 VITALS — BP 126/85 | HR 89 | Wt 234.0 lb

## 2019-12-22 DIAGNOSIS — Z3493 Encounter for supervision of normal pregnancy, unspecified, third trimester: Secondary | ICD-10-CM

## 2019-12-22 DIAGNOSIS — Z3A28 28 weeks gestation of pregnancy: Secondary | ICD-10-CM

## 2019-12-22 DIAGNOSIS — Z23 Encounter for immunization: Secondary | ICD-10-CM

## 2019-12-22 DIAGNOSIS — B3731 Acute candidiasis of vulva and vagina: Secondary | ICD-10-CM

## 2019-12-22 DIAGNOSIS — B373 Candidiasis of vulva and vagina: Secondary | ICD-10-CM

## 2019-12-22 DIAGNOSIS — Z3492 Encounter for supervision of normal pregnancy, unspecified, second trimester: Secondary | ICD-10-CM

## 2019-12-22 DIAGNOSIS — Z98891 History of uterine scar from previous surgery: Secondary | ICD-10-CM

## 2019-12-22 MED ORDER — MICONAZOLE NITRATE 2 % VA CREA: 1.0000 | TOPICAL_CREAM | Freq: Every day | VAGINAL | 2 refills | Status: DC

## 2019-12-22 NOTE — Progress Notes (Signed)
Prenatal Visit Note Date: 12/22/2019 Clinic: [facility]  Subjective:  [name] is a 29 y.o. [phone] at [redacted]w[redacted]d being seen today for ongoing prenatal care.  She is currently monitored for the following issues for this low-risk pregnancy and has Supervision of low-risk pregnancy; History of C-section; Cervical high risk human papillomavirus (HPV) DNA test positive; and Obesity in pregnancy, antepartum on their problem list.  Patient reports vaginal irritation.   Contractions: Not present. Vag. Bleeding: None.  Movement: Present. Denies leaking of fluid.   The following portions of the patient's history were reviewed and updated as appropriate: allergies, current medications, past family history, past medical history, past social history, past surgical history and problem list. Problem list updated.  Objective:   Vitals:   12/22/19 0919  BP: 126/85  Pulse: 89  Weight: 234 lb (106.1 kg)    Fetal Status: Fetal Heart Rate (bpm): 134   Movement: Present     General:  Alert, oriented and cooperative. Patient is in no acute distress.  Skin: Skin is warm and dry. No rash noted.   Cardiovascular: Normal heart rate noted  Respiratory: Normal respiratory effort, no problems with respiration noted  Abdomen: Soft, gravid, appropriate for gestational age. Pain/Pressure: Absent     Pelvic:  Cervical exam deferred      white cottage cheese like d/c at the introitus  Extremities: Normal range of motion.  Edema: None  Mental Status: Normal mood and affect. Normal behavior. Normal judgment and thought content.   Urinalysis:      Assessment and Plan:  Pregnancy: [id number] at [redacted]w[redacted]d  1. Encounter for supervision of low-risk pregnancy in second trimester Routine care. 28wk labs today. Pt leaning towards nexplanon  2. Vulvovaginal candidiasis Monistat 7  3. History of C-section Desires another vbac. Consent signed today.   Preterm labor symptoms and general obstetric  precautions including but not limited to vaginal bleeding, contractions, leaking of fluid and fetal movement were reviewed in detail with the patient. Please refer to After Visit Summary for other counseling recommendations.  Return in about 2 weeks (around 01/05/2020) for 2-3 wk, in person or virtual, md or app.   Aletha Halim, MD

## 2019-12-23 ENCOUNTER — Encounter: Payer: Self-pay | Admitting: Obstetrics and Gynecology

## 2019-12-23 DIAGNOSIS — D696 Thrombocytopenia, unspecified: Secondary | ICD-10-CM | POA: Insufficient documentation

## 2019-12-23 LAB — RPR: RPR Ser Ql: NONREACTIVE

## 2019-12-23 LAB — CBC
Hematocrit: 34.3 % (ref 34.0–46.6)
Hemoglobin: 11.4 g/dL (ref 11.1–15.9)
MCH: 29.2 pg (ref 26.6–33.0)
MCHC: 33.2 g/dL (ref 31.5–35.7)
MCV: 88 fL (ref 79–97)
Platelets: 146 10*3/uL — ABNORMAL LOW (ref 150–450)
RBC: 3.9 x10E6/uL (ref 3.77–5.28)
RDW: 13 % (ref 11.7–15.4)
WBC: 7.7 10*3/uL (ref 3.4–10.8)

## 2019-12-23 LAB — GLUCOSE TOLERANCE, 2 HOURS W/ 1HR
Glucose, 1 hour: 122 mg/dL (ref 65–179)
Glucose, 2 hour: 103 mg/dL (ref 65–152)
Glucose, Fasting: 84 mg/dL (ref 65–91)

## 2019-12-23 LAB — HIV ANTIBODY (ROUTINE TESTING W REFLEX): HIV Screen 4th Generation wRfx: NONREACTIVE

## 2019-12-27 ENCOUNTER — Encounter: Payer: Self-pay | Admitting: *Deleted

## 2020-01-11 ENCOUNTER — Telehealth (INDEPENDENT_AMBULATORY_CARE_PROVIDER_SITE_OTHER): Payer: Medicaid Other | Admitting: Nurse Practitioner

## 2020-01-11 DIAGNOSIS — O26893 Other specified pregnancy related conditions, third trimester: Secondary | ICD-10-CM

## 2020-01-11 DIAGNOSIS — R12 Heartburn: Secondary | ICD-10-CM

## 2020-01-11 DIAGNOSIS — Z98891 History of uterine scar from previous surgery: Secondary | ICD-10-CM

## 2020-01-11 DIAGNOSIS — R87810 Cervical high risk human papillomavirus (HPV) DNA test positive: Secondary | ICD-10-CM

## 2020-01-11 DIAGNOSIS — Z3A31 31 weeks gestation of pregnancy: Secondary | ICD-10-CM

## 2020-01-11 DIAGNOSIS — O99113 Other diseases of the blood and blood-forming organs and certain disorders involving the immune mechanism complicating pregnancy, third trimester: Secondary | ICD-10-CM

## 2020-01-11 DIAGNOSIS — D696 Thrombocytopenia, unspecified: Secondary | ICD-10-CM

## 2020-01-11 DIAGNOSIS — E669 Obesity, unspecified: Secondary | ICD-10-CM

## 2020-01-11 DIAGNOSIS — O99213 Obesity complicating pregnancy, third trimester: Secondary | ICD-10-CM

## 2020-01-11 NOTE — Progress Notes (Signed)
Pt is at work ,does not have BP Cuff. Will take once she gets home & send thru My Chart.

## 2020-01-11 NOTE — Progress Notes (Signed)
   OBSTETRICS PRENATAL VIRTUAL VISIT ENCOUNTER NOTE  Provider location: [facility]   I connected with [name] on 01/11/20 at  2:55 PM EST by MyChart Video Encounter at work and verified that I am speaking with the correct person using two identifiers.   I discussed the limitations, risks, security and privacy concerns of performing an evaluation and management service virtually and the availability of in person appointments. I also discussed with the patient that there may be a patient responsible charge related to this service. The patient expressed understanding and agreed to proceed. Subjective:  [name] is a 29 y.o. [id number] at [redacted]w[redacted]d being seen today for ongoing prenatal care.  She is currently monitored for the following issues for this low-risk pregnancy and has Supervision of low-risk pregnancy; History of VBAC x 2; Cervical high risk human papillomavirus (HPV) DNA test positive; Obesity in pregnancy, antepartum; and Gestational thrombocytopenia ([place]) on their problem list.  Patient reports heartburn. Has not tried any medication for the heartburn. Contractions: Not present. Vag. Bleeding: None.  Movement: Present. Denies any leaking of fluid.   The following portions of the patient's history were reviewed and updated as appropriate: allergies, current medications, past family history, past medical history, past social history, past surgical history and problem list.   Objective:  There were no vitals filed for this visit.  Fetal Status:     Movement: Present     General:  Alert, oriented and cooperative. Patient is in no acute distress.  Respiratory: Normal respiratory effort, no problems with respiration noted  Mental Status: Normal mood and affect. Normal behavior. Normal judgment and thought content.  Rest of physical exam deferred due to type of encounter  Has not checked BP yet today and cannot due to being at work.  Will  check later today.  Imaging: No results found.  Assessment and Plan:  Pregnancy: [id number] at [redacted]w[redacted]d 1.  Supervision of other pregnancy No BP info in Babyscripts Reviewed BP that is too high - 140/90 (either value) and advised to call the office if she has a reading that is too high.  2. History of VBAC Plans VBAC  3. Thrombocytopenia ([employer]) Will have an in person visit at next ROB as CBC needs to be repeated.  Order placed. - CBC w/Diff/Platelet; Future  4.  Heartburn Has not tried any medication.  Wants to try Tums first.  Advised to call if Tums are not effective for her.  Medication can be prescribed to help her.  Preterm labor symptoms and general obstetric precautions including but not limited to vaginal bleeding, contractions, leaking of fluid and fetal movement were reviewed in detail with the patient. I discussed the assessment and treatment plan with the patient. The patient was provided an opportunity to ask questions and all were answered. The patient agreed with the plan and demonstrated an understanding of the instructions. The patient was advised to call back or seek an in-person office evaluation/go to MAU at [facility] for any urgent or concerning symptoms. Please refer to After Visit Summary for other counseling recommendations.   I provided 6 minutes of face-to-face time during this encounter.  Return in about 2 weeks (around 01/25/2020) for in person ROB, needs labs drawn.  No future appointments.- none scheduled at the time note created.  Virginia Rochester, NP [facility], [place]

## 2020-01-16 ENCOUNTER — Inpatient Hospital Stay (HOSPITAL_COMMUNITY)
Admission: AD | Admit: 2020-01-16 | Discharge: 2020-01-16 | Disposition: A | Discharge status: Home / Self Care | Payer: 59 | Attending: Obstetrics and Gynecology | Admitting: Obstetrics and Gynecology

## 2020-01-16 ENCOUNTER — Encounter (HOSPITAL_COMMUNITY): Payer: Self-pay | Admitting: Obstetrics and Gynecology

## 2020-01-16 ENCOUNTER — Other Ambulatory Visit: Payer: Self-pay

## 2020-01-16 DIAGNOSIS — Z3A31 31 weeks gestation of pregnancy: Secondary | ICD-10-CM | POA: Diagnosis not present

## 2020-01-16 DIAGNOSIS — O26893 Other specified pregnancy related conditions, third trimester: Secondary | ICD-10-CM | POA: Diagnosis not present

## 2020-01-16 DIAGNOSIS — Z3689 Encounter for other specified antenatal screening: Secondary | ICD-10-CM

## 2020-01-16 DIAGNOSIS — Z3A32 32 weeks gestation of pregnancy: Secondary | ICD-10-CM

## 2020-01-16 DIAGNOSIS — O479 False labor, unspecified: Secondary | ICD-10-CM

## 2020-01-16 DIAGNOSIS — R102 Pelvic and perineal pain: Secondary | ICD-10-CM | POA: Diagnosis not present

## 2020-01-16 LAB — URINALYSIS, ROUTINE W REFLEX MICROSCOPIC
Bilirubin Urine: NEGATIVE
Glucose, UA: NEGATIVE mg/dL
Hgb urine dipstick: NEGATIVE
Ketones, ur: NEGATIVE mg/dL
Leukocytes,Ua: NEGATIVE
Nitrite: NEGATIVE
Protein, ur: NEGATIVE mg/dL
Specific Gravity, Urine: 1.014 (ref 1.005–1.030)
pH: 7 (ref 5.0–8.0)

## 2020-01-16 LAB — GC/CHLAMYDIA PROBE AMP (~~LOC~~) NOT AT ARMC
Chlamydia: NEGATIVE
Comment: NEGATIVE
Comment: NORMAL
Neisseria Gonorrhea: NEGATIVE

## 2020-01-16 LAB — WET PREP, GENITAL
Clue Cells Wet Prep HPF POC: NONE SEEN
Sperm: NONE SEEN
Trich, Wet Prep: NONE SEEN
Yeast Wet Prep HPF POC: NONE SEEN

## 2020-01-16 NOTE — MAU Note (Signed)
Patient reports contractions every 10-15 mins.  Denies LOF/VB.  Endorses + FM.

## 2020-01-16 NOTE — MAU Provider Note (Signed)
History     CSN: [id number]  Arrival date and time: 01/16/20 1059   First Provider Initiated Contact with Patient 01/16/20 1243      Chief Complaint  Patient presents with  . Contractions   [name] is a 29 y.o. [id number] at [redacted]w[redacted]d who receives care at [facility].  She presents today for Contractions. Patient states she was awakened out of her sleep around 0300 due to contractions.  She reports they were about Q32min.  Patient reports the contractions were "intense" and rated them a 5/10, but now are "more spread out" and rates them a 3/10. Patient endorses fetal movement and denies vaginal discharge, bleeding, and leaking.  Patient also denies recent sexual activity or issues with urination.  She reports some pelvic pressure, but denies abdominal pain. She reports she had a yeast infection 2-3 weeks ago and treated it with monistat.     OB History    Gravida  4   Para  3   Term  3   Preterm      AB      Living  2     SAB      TAB      Ectopic      Multiple  0   Live Births  3           Past Medical History:  Diagnosis Date  . No pertinent past medical history     Past Surgical History:  Procedure Laterality Date  . 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 CHOLECYSTECTOMY    . NO PAST SURGERIES    . 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 Down syndrome Other        maternal half-nephew  . Diabetes Mother   . Diabetes Maternal Grandmother   . Diabetes Paternal Grandmother   . Other Neg Hx     Social History   Tobacco Use  . Smoking status: Former Smoker    Packs/day: 0.25    Types: Cigarettes  . Smokeless tobacco: Former Systems developer    Quit date: 06/29/2019  Substance Use Topics  . Alcohol use: Yes     Comment: occ  . Drug use: Yes    Types: Marijuana    Comment: occ    Allergies: No Known Allergies  Medications Prior to Admission  Medication Sig Dispense Refill Last Dose  . aspirin EC 81 MG tablet Take 1 tablet (81 mg total) by mouth daily. (Patient not taking: Reported on 10/20/2019) 60 tablet 2   . Prenatal Vit-Fe Fumarate-FA (MULTIVITAMIN-PRENATAL) 27-0.8 MG TABS tablet Take 1 tablet daily at 12 noon by mouth. 100 tablet 3     Review of Systems  Constitutional: Negative for chills and fever.  Respiratory: Negative for cough and shortness of breath.   Gastrointestinal: Negative for abdominal pain, nausea and vomiting.  Genitourinary: Positive for pelvic pain (Pressure). Negative for difficulty urinating and dysuria.  Neurological: Negative for dizziness, light-headedness and headaches.   Physical Exam   Blood pressure 112/66, pulse 99, temperature 98.1 F (36.7 C), resp. rate 17, weight 107.6 kg, unknown if currently breastfeeding.  Physical Exam Vitals reviewed.  Constitutional:      Appearance: Normal appearance.  HENT:  Head: Normocephalic and atraumatic.  Eyes:     Conjunctiva/sclera: Conjunctivae normal.  Cardiovascular:     Rate and Rhythm: Normal rate and regular rhythm.     Heart sounds: Normal heart sounds.  Pulmonary:     Effort: Pulmonary effort is normal. No respiratory distress.     Breath sounds: Normal breath sounds.  Abdominal:     General: Bowel sounds are normal.  Genitourinary:    Comments: Speculum Exam: -Normal External Genitalia: Non tender, no apparent discharge at introitus.  -Vaginal Vault: Pink mucosa with good rugae.  -wet prep collected -Cervix:Pink, no lesions, cysts, or polyps.  Appears closed. No active bleeding from os-GC/CT collected -Bimanual Exam: Dilation: Closed (3 ext, closed int) Exam by:: [name]  Musculoskeletal:        General: Normal range of motion.     Cervical back: Normal range of motion.  Skin:     General: Skin is warm and dry.  Neurological:     Mental Status: She is alert and oriented to person, place, and time.  Psychiatric:        Mood and Affect: Mood normal.        Behavior: Behavior normal.        Thought Content: Thought content normal.     Fetal Assessment 120 bpm, Mod Var, -Decels, +Accels Toco: Occ  MAU Course    Results for orders placed or performed during the hospital encounter of 01/16/20 (from the past 48 hour(s))  Urinalysis, Routine w reflex microscopic Urine, Clean Catch     Status: None   Collection Time: 01/16/20 11:50 AM  Result Value Ref Range   Color, Urine YELLOW YELLOW   APPearance CLEAR CLEAR   Specific Gravity, Urine 1.014 1.005 - 1.030   pH 7.0 5.0 - 8.0   Glucose, UA NEGATIVE NEGATIVE mg/dL   Hgb urine dipstick NEGATIVE NEGATIVE   Bilirubin Urine NEGATIVE NEGATIVE   Ketones, ur NEGATIVE NEGATIVE mg/dL   Protein, ur NEGATIVE NEGATIVE mg/dL   Nitrite NEGATIVE NEGATIVE   Leukocytes,Ua NEGATIVE NEGATIVE    Comment: Performed at [facility] [address] [place], [place] [address]  GC/Chlamydia probe amp ([place])not at [facility]     Status: None   Collection Time: 01/16/20 12:46 PM  Result Value Ref Range   Neisseria Gonorrhea Negative    Chlamydia Negative    Comment Normal Reference [place] Chlamydia - Negative    Comment      Normal Reference Range Neisseria Gonorrhea - Negative  Wet prep, genital     Status: Abnormal   Collection Time: 01/16/20  1:07 PM  Result Value Ref Range   Yeast Wet Prep HPF POC NONE SEEN NONE SEEN   Trich, Wet Prep NONE SEEN NONE SEEN   Clue Cells Wet Prep HPF POC NONE SEEN NONE SEEN   WBC, Wet Prep HPF POC MANY (A) NONE SEEN   Sperm NONE SEEN     Comment: Performed at [facility], [place] [address], [place], [place] [address]     No results found.  MDM PE Labs:UA, Wet Prep, GC/CT EFM Assessment and Plan  29 year old [id number]  SIUP at 32weeks Cat I FT Pelvic Pain  -POC  Reviewed. -Exam performed and findings discussed. -Cultures collected and pending.  -NST Reactive. Okay to discontinue monitoring. -Will await results. -Patient offered and declines pain medication.  Brittany Conners MSN, [name] 01/16/2020, 12:44 PM   Reassessment (2:06 PM)  -Labs as above. -Informed  that results negative. -Discussed that pelvic pressure likely from normal pregnancy variation when considering gestational age and parity history. -Patient questions when she should be concerned regarding pain and discomfort. -Encouraged to call or report to MAU with any concerns or questions that may arise. -Patient request and given OOW note for missing 1st half of work day. -Encouraged to call or return to MAU if symptoms worsen or with the onset of new symptoms. -Discharged to home in stable condition.  Brittany Conners MSN, [name] Advanced Practice Provider, [facility]

## 2020-01-16 NOTE — Discharge Instructions (Signed)

## 2020-02-01 ENCOUNTER — Telehealth (INDEPENDENT_AMBULATORY_CARE_PROVIDER_SITE_OTHER): Payer: Medicaid Other | Admitting: Obstetrics & Gynecology

## 2020-02-01 ENCOUNTER — Other Ambulatory Visit: Payer: Self-pay

## 2020-02-01 ENCOUNTER — Encounter: Payer: Self-pay | Admitting: Obstetrics & Gynecology

## 2020-02-01 DIAGNOSIS — Z3A34 34 weeks gestation of pregnancy: Secondary | ICD-10-CM

## 2020-02-01 NOTE — Progress Notes (Signed)
Rescheduled due to pt not having access to virtual visit today.

## 2020-02-01 NOTE — Progress Notes (Signed)
Called pt at 1557; VM left stating I am calling to check pt in for virtual appt and pt should join MyChart within 15 minutes to complete visit. Called pt at 1612. Pt answers and states she is driving at this time. I explained we cannot complete virtual or telephone visit while pt is driving. Pt unable to park anywhere due to heavy traffic. Pt would like to be rescheduled. Explained pt will be rescheduled virtually unless it is after 36 weeks, in that case pt will be scheduled in-person. [facility] office notified to reschedule.   Apolonio Schneiders RN 02/01/20

## 2020-02-10 NOTE — L&D Delivery Note (Addendum)
OB/GYN Faculty Practice Delivery Note  [name] is a 30 y.o. [id number] at [redacted]w[redacted]d. She was admitted for SOL.   ROM: 4h 16m with clear fluid GBS Status: neg Maximum Maternal Temperature: 98.3*F  Labor Progress: Patient presented in SOL, AROM at 0645 and progressed to complete.  Delivery Date/Time: March 11, 2020 at  Delivery: Called to room and patient was complete and pushing. Head delivered LOA. No nuchal cord present. Shoulder and body delivered in usual fashion. Infant with spontaneous cry, placed on mother's abdomen, dried and stimulated. Cord clamped x 2 after 1-minute delay, and cut by MGM. Cord blood drawn. Placenta delivered spontaneously with gentle cord traction. Fundus firm with massage and Pitocin. Labia, perineum, vagina, and cervix inspected with bilateral labial lacerations.   Placenta: Spontaneous, intact, 3VC. Complications: none Lacerations: bilateral labial lacerations: repaired with 3-0 SH suture  EBL: 200 cc Analgesia: epidural  Infant: female  APGARs 9,9  weight pending per medical chart  to newborn care/skin-to-skin  Gladys Damme, MD [place], PGY-2 03/11/2020, 11:55 AM  I was present and gloved for the entirety of the delivery. I agree with the findings and the plan of care as documented in the resident's note.  Sharene Skeans, MD [facility] Family Medicine Fellow, [facility] for [facility], [place]

## 2020-02-15 ENCOUNTER — Encounter: Payer: Self-pay | Admitting: Obstetrics & Gynecology

## 2020-02-15 ENCOUNTER — Telehealth (INDEPENDENT_AMBULATORY_CARE_PROVIDER_SITE_OTHER): Payer: Medicaid Other | Admitting: Obstetrics & Gynecology

## 2020-02-15 DIAGNOSIS — O34219 Maternal care for unspecified type scar from previous cesarean delivery: Secondary | ICD-10-CM

## 2020-02-15 DIAGNOSIS — Z3A36 36 weeks gestation of pregnancy: Secondary | ICD-10-CM

## 2020-02-15 DIAGNOSIS — Z98891 History of uterine scar from previous surgery: Secondary | ICD-10-CM

## 2020-02-15 DIAGNOSIS — Z3492 Encounter for supervision of normal pregnancy, unspecified, second trimester: Secondary | ICD-10-CM

## 2020-02-15 NOTE — Progress Notes (Signed)
I connected with [name] 02/15/20 at  3:55 PM EST by: MyChart video and verified that I am speaking with the correct person using two identifiers.  Patient is located at home and provider is located at [facility]     The purpose of this virtual visit is to provide medical care while limiting exposure to the novel coronavirus. I discussed the limitations, risks, security and privacy concerns of performing an evaluation and management service by MyChart video and the availability of in person appointments. I also discussed with the patient that there may be a patient responsible charge related to this service. By engaging in this virtual visit, you consent to the provision of healthcare.  Additionally, you authorize for your insurance to be billed for the services provided during this visit.  The patient expressed understanding and agreed to proceed.  The following staff members participated in the virtual visit:  Maxwell Marion, RN    PRENATAL VISIT NOTE  Subjective:  [name] is a 30 y.o. [id number] at [redacted]w[redacted]d  for phone visit for ongoing prenatal care.  She is currently monitored for the following issues for this low-risk pregnancy and has Supervision of low-risk pregnancy; History of VBAC x 2; Cervical high risk human papillomavirus (HPV) DNA test positive; Obesity in pregnancy, antepartum; and Gestational thrombocytopenia ([facility]) on their problem list.  Patient reports no complaints.  Contractions: Irritability. Vag. Bleeding: None.  Movement: Present. Denies leaking of fluid.  Denies any swelling, headache, visual changes.   The following portions of the patient's history were reviewed and updated as appropriate: allergies, current medications, past family history, past medical history, past social history, past surgical history and problem list.   Objective:  There were no vitals filed for this visit. Self-Obtained.  Pt does not have BP cuff at home.  Fetal Status:     Movement: Present      Assessment and Plan:  Pregnancy: G4P3002 at [redacted]w[redacted]d  1. [redacted] weeks gestation of pregnancy - continue PNV - return 1 week.  Needs cultures at next visit.   2. History of VBAC - planning VBAC.  Consent signed.  3. Encounter for supervision of low-risk pregnancy in second trimester  Preterm labor symptoms and general obstetric precautions including but not limited to vaginal bleeding, contractions, leaking of fluid and fetal movement were reviewed in detail with the patient.  Return in about 1 week (around 02/22/2020) for GC/Chl/GBS.  No future appointments.   Time spent on virtual visit: 15 minutes  Jerene Bears, MD

## 2020-02-15 NOTE — Progress Notes (Signed)
Called pt at 1600; VM left stating I am calling to check pt in for appt, requested pt callback or join MyChart.   Called pt at 1612; pt answers, states they are not able to log into MyChart, requests text link for video. Link sent.   I connected with  Leana Roe on 02/15/20 at 1621 by telephone and verified that I am speaking with the correct person using two identifiers.   I discussed the limitations, risks, security and privacy concerns of performing an evaluation and management service by telephone and the availability of in person appointments. I also discussed with the patient that there may be a patient responsible charge related to this service. The patient expressed understanding and agreed to proceed.  Marjo Bicker, RN 02/15/2020  4:21 PM

## 2020-02-17 ENCOUNTER — Other Ambulatory Visit: Payer: Self-pay

## 2020-02-17 ENCOUNTER — Encounter (HOSPITAL_COMMUNITY): Payer: Self-pay | Admitting: Family Medicine

## 2020-02-17 ENCOUNTER — Inpatient Hospital Stay (HOSPITAL_COMMUNITY)
Admission: AD | Admit: 2020-02-17 | Discharge: 2020-02-17 | Disposition: A | Discharge status: Home / Self Care | Payer: Medicaid Other | Attending: Family Medicine | Admitting: Family Medicine

## 2020-02-17 DIAGNOSIS — O99891 Other specified diseases and conditions complicating pregnancy: Secondary | ICD-10-CM

## 2020-02-17 DIAGNOSIS — Z3689 Encounter for other specified antenatal screening: Secondary | ICD-10-CM

## 2020-02-17 DIAGNOSIS — Z7982 Long term (current) use of aspirin: Secondary | ICD-10-CM | POA: Insufficient documentation

## 2020-02-17 DIAGNOSIS — Z3A36 36 weeks gestation of pregnancy: Secondary | ICD-10-CM

## 2020-02-17 DIAGNOSIS — O368130 Decreased fetal movements, third trimester, not applicable or unspecified: Secondary | ICD-10-CM | POA: Diagnosis present

## 2020-02-17 DIAGNOSIS — R748 Abnormal levels of other serum enzymes: Secondary | ICD-10-CM | POA: Insufficient documentation

## 2020-02-17 DIAGNOSIS — Z87891 Personal history of nicotine dependence: Secondary | ICD-10-CM | POA: Diagnosis not present

## 2020-02-17 DIAGNOSIS — O98513 Other viral diseases complicating pregnancy, third trimester: Secondary | ICD-10-CM

## 2020-02-17 DIAGNOSIS — U071 COVID-19: Secondary | ICD-10-CM

## 2020-02-17 DIAGNOSIS — O98519 Other viral diseases complicating pregnancy, unspecified trimester: Secondary | ICD-10-CM

## 2020-02-17 DIAGNOSIS — R7401 Elevation of levels of liver transaminase levels: Secondary | ICD-10-CM

## 2020-02-17 LAB — COMPREHENSIVE METABOLIC PANEL
ALT: 47 U/L — ABNORMAL HIGH (ref 0–44)
AST: 106 U/L — ABNORMAL HIGH (ref 15–41)
Albumin: 2.9 g/dL — ABNORMAL LOW (ref 3.5–5.0)
Alkaline Phosphatase: 91 U/L (ref 38–126)
Anion gap: 12 (ref 5–15)
BUN: 6 mg/dL (ref 6–20)
CO2: 16 mmol/L — ABNORMAL LOW (ref 22–32)
Calcium: 8.6 mg/dL — ABNORMAL LOW (ref 8.9–10.3)
Chloride: 103 mmol/L (ref 98–111)
Creatinine, Ser: 0.75 mg/dL (ref 0.44–1.00)
GFR, Estimated: >60 mL/min (ref >60)
Glucose, Bld: 116 mg/dL — ABNORMAL HIGH (ref 70–99)
Potassium: 3.6 mmol/L (ref 3.5–5.1)
Sodium: 131 mmol/L — ABNORMAL LOW (ref 135–145)
Total Bilirubin: 1.1 mg/dL (ref 0.3–1.2)
Total Protein: 6.2 g/dL — ABNORMAL LOW (ref 6.5–8.1)

## 2020-02-17 LAB — CBC WITH DIFFERENTIAL/PLATELET
Abs Immature Granulocytes: 0.04 10*3/uL (ref 0.00–0.07)
Basophils Absolute: 0 10*3/uL (ref 0.0–0.1)
Basophils Relative: 0 %
Eosinophils Absolute: 0 10*3/uL (ref 0.0–0.5)
Eosinophils Relative: 0 %
HCT: 32 % — ABNORMAL LOW (ref 36.0–46.0)
Hemoglobin: 10.4 g/dL — ABNORMAL LOW (ref 12.0–15.0)
Immature Granulocytes: 1 %
Lymphocytes Relative: 7 %
Lymphs Abs: 0.5 10*3/uL — ABNORMAL LOW (ref 0.7–4.0)
MCH: 27.2 pg (ref 26.0–34.0)
MCHC: 32.5 g/dL (ref 30.0–36.0)
MCV: 83.8 fL (ref 80.0–100.0)
Monocytes Absolute: 0.4 10*3/uL (ref 0.1–1.0)
Monocytes Relative: 5 %
Neutro Abs: 5.8 10*3/uL (ref 1.7–7.7)
Neutrophils Relative %: 87 %
Platelets: 148 10*3/uL — ABNORMAL LOW (ref 150–400)
RBC: 3.82 MIL/uL — ABNORMAL LOW (ref 3.87–5.11)
RDW: 13.8 % (ref 11.5–15.5)
WBC: 6.7 10*3/uL (ref 4.0–10.5)
nRBC: 0 % (ref 0.0–0.2)

## 2020-02-17 LAB — URINALYSIS, ROUTINE W REFLEX MICROSCOPIC
Bilirubin Urine: NEGATIVE
Glucose, UA: NEGATIVE mg/dL
Hgb urine dipstick: NEGATIVE
Ketones, ur: 80 mg/dL — AB
Leukocytes,Ua: NEGATIVE
Nitrite: NEGATIVE
Protein, ur: NEGATIVE mg/dL
Specific Gravity, Urine: 1.013 (ref 1.005–1.030)
pH: 6 (ref 5.0–8.0)

## 2020-02-17 LAB — BRAIN NATRIURETIC PEPTIDE: B Natriuretic Peptide: 37.6 pg/mL (ref 0.0–100.0)

## 2020-02-17 LAB — PROTEIN / CREATININE RATIO, URINE
Creatinine, Urine: 118.17 mg/dL
Protein Creatinine Ratio: 0.14 mg/mg{Cre} (ref 0.00–0.15)
Total Protein, Urine: 16 mg/dL

## 2020-02-17 LAB — TROPONIN I (HIGH SENSITIVITY): Troponin I (High Sensitivity): 5 ng/L (ref <18)

## 2020-02-17 LAB — RESP PANEL BY RT-PCR (FLU A&B, COVID) ARPGX2
Influenza A by PCR: NEGATIVE
Influenza B by PCR: NEGATIVE
SARS Coronavirus 2 by RT PCR: POSITIVE — AB

## 2020-02-17 LAB — OB RESULTS CONSOLE GBS: GBS: NEGATIVE

## 2020-02-17 LAB — LACTATE DEHYDROGENASE: LDH: 145 U/L (ref 98–192)

## 2020-02-17 MED ORDER — LACTATED RINGERS IV BOLUS
1000.0000 mL | Freq: Once | INTRAVENOUS | Status: AC
  Administered 2020-02-17: 1000 mL via INTRAVENOUS

## 2020-02-17 MED ORDER — FLUTICASONE PROPIONATE 50 MCG/ACT NA SUSP: 2.0000 | Freq: Every day | NASAL | 0 refills | Status: DC

## 2020-02-17 MED ORDER — ACETAMINOPHEN 500 MG PO TABS
1000.0000 mg | ORAL_TABLET | Freq: Once | ORAL | Status: AC
  Administered 2020-02-17: 1000 mg via ORAL
  Filled 2020-02-17: qty 2

## 2020-02-17 NOTE — Progress Notes (Signed)
   02/17/20 1929  Vital Signs  Pulse Rate (!) 118  Oxygen Therapy  SpO2 99 %  Pt c/o of "not feeling well", nausea and chest tightness, this may be likely due to a drop in HR. Pt's previous HR was in 150s. Stayed with pt and reassured. Will continue to monitor.

## 2020-02-17 NOTE — MAU Note (Signed)
[name] is a 30 y.o. at [redacted]w[redacted]d here in MAU reporting: DFM that started today, states she has only felt a couple of movements. Also complaining of vague muscle pains, leg pain, and a headache.  Onset of complaint: today  FHT: 152

## 2020-02-17 NOTE — MAU Provider Note (Signed)
History     CSN: [id number]  Arrival date and time: 02/17/20 1845   Event Date/Time   First Provider Initiated Contact with Patient 02/17/20 1932      Chief Complaint  Patient presents with  . Decreased Fetal Movement   HPI [name] is a 30 y.o. [id number] at [redacted]w[redacted]d who presents with decreased fetal movement. She reports decreased movement since she woke up today. She states when she woke up, she also felt generally unwell with muscle/body aches and leg pain. She denies any shortness of breath, cough, sore throat or loss of taste or smell. She is not vaccinated for COVID. She denies any abdominal pain, vaginal bleeding or leaking of fluid. She denies any problems in this pregnancy.   OB History    Gravida  4   Para  3   Term  3   Preterm      AB      Living  2     SAB      IAB      Ectopic      Multiple  0   Live Births  3           Past Medical History:  Diagnosis Date  . No pertinent past medical history     Past Surgical History:  Procedure Laterality Date  . 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 CHOLECYSTECTOMY    . NO PAST SURGERIES    . 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 Down syndrome Other        maternal half-nephew  . Diabetes Mother   . Diabetes Maternal Grandmother   . Diabetes Paternal Grandmother   . Other Neg Hx     Social History   Tobacco Use  . Smoking status: Former Smoker    Packs/day: 0.25    Types: Cigarettes  . Smokeless tobacco: Former Systems developer    Quit date: 06/29/2019  Substance Use Topics  . Alcohol use: Yes    Comment: occ  . Drug use: Yes    Types: Marijuana    Comment: occ    Allergies: No Known Allergies  Medications Prior to Admission  Medication Sig  Dispense Refill Last Dose  . aspirin EC 81 MG tablet Take 1 tablet (81 mg total) by mouth daily. 60 tablet 2   . Prenatal Vit-Fe Fumarate-FA (MULTIVITAMIN-PRENATAL) 27-0.8 MG TABS tablet Take 1 tablet daily at 12 noon by mouth. 100 tablet 3     Review of Systems  Constitutional: Negative.  Negative for fatigue and fever.  HENT: Negative.   Respiratory: Negative.  Negative for shortness of breath.   Cardiovascular: Negative.  Negative for chest pain.  Gastrointestinal: Negative.  Negative for abdominal pain, constipation, diarrhea, nausea and vomiting.  Genitourinary: Negative.  Negative for dysuria, vaginal bleeding and vaginal discharge.  Musculoskeletal:       Body aches and leg pain  Neurological: Negative.  Negative for dizziness and headaches.   Physical Exam   Blood pressure 100/63, pulse (!) 123, temperature 98.2 F (36.8 C), temperature source Oral, resp. rate 20, SpO2 98 %, unknown if currently breastfeeding.  Patient Vitals for the past 24 hrs:  BP Temp Temp src Pulse  Resp SpO2  02/17/20 2120 100/63 -- -- (!) 123 -- 98 %  02/17/20 2110 (!) 102/59 -- -- (!) 124 -- 98 %  02/17/20 2100 (!) 89/62 -- -- (!) 132 -- 99 %  02/17/20 2050 103/64 -- -- (!) 133 -- 99 %  02/17/20 2040 (!) 106/56 -- -- (!) 143 -- 100 %  02/17/20 2030 98/62 -- -- (!) 129 -- 100 %  02/17/20 2023 96/61 -- -- (!) 131 -- --  02/17/20 1950 -- -- -- -- -- 100 %  02/17/20 1945 -- -- -- -- -- 100 %  02/17/20 1929 -- -- -- (!) 118 -- 99 %  02/17/20 1920 -- -- -- -- -- 97 %  02/17/20 1913 (!) 90/54 98.2 F (36.8 C) Oral (!) 152 20 --    Physical Exam Vitals and nursing note reviewed.  Constitutional:      General: She is not in acute distress.    Appearance: She is well-developed and well-nourished.  HENT:     Head: Normocephalic.  Eyes:     Pupils: Pupils are equal, round, and reactive to light.  Cardiovascular:     Rate and Rhythm: Normal rate and regular rhythm.     Heart sounds: Normal heart  sounds.  Pulmonary:     Effort: Pulmonary effort is normal. No respiratory distress.     Breath sounds: Normal breath sounds.  Abdominal:     General: Bowel sounds are normal. There is no distension.     Palpations: Abdomen is soft.     Tenderness: There is no abdominal tenderness.  Skin:    General: Skin is warm and dry.  Neurological:     Mental Status: She is alert and oriented to person, place, and time.  Psychiatric:        Mood and Affect: Mood and affect normal.        Behavior: Behavior normal.        Thought Content: Thought content normal.        Judgment: Judgment normal.    Fetal Tracing:  Baseline: 145 Variability: moderate Accels: 15x15 Decels: none  Toco: none   MAU Course  Procedures Results for orders placed or performed during the hospital encounter of 02/17/20 (from the past 24 hour(s))  Brain natriuretic peptide     Status: None   Collection Time: 02/17/20  7:27 PM  Result Value Ref Range   B Natriuretic Peptide 37.6 0.0 - 100.0 pg/mL  Resp Panel by RT-PCR (Flu A&B, Covid) Nasopharyngeal Swab     Status: Abnormal   Collection Time: 02/17/20  7:28 PM   Specimen: Nasopharyngeal Swab; Nasopharyngeal(NP) swabs in vial transport medium  Result Value Ref Range   SARS Coronavirus 2 by RT PCR POSITIVE (A) NEGATIVE   Influenza A by PCR NEGATIVE NEGATIVE   Influenza B by PCR NEGATIVE NEGATIVE  CBC with Differential/Platelet     Status: Abnormal   Collection Time: 02/17/20  7:30 PM  Result Value Ref Range   WBC 6.7 4.0 - 10.5 K/uL   RBC 3.82 (L) 3.87 - 5.11 MIL/uL   Hemoglobin 10.4 (L) 12.0 - 15.0 g/dL   HCT 32.0 (L) 36.0 - 46.0 %   MCV 83.8 80.0 - 100.0 fL   MCH 27.2 26.0 - 34.0 pg   MCHC 32.5 30.0 - 36.0 g/dL   RDW 13.8 11.5 - 15.5 %   Platelets 148 (L) 150 - 400 K/uL   nRBC 0.0 0.0 - 0.2 %   Neutrophils Relative %  87 %   Neutro Abs 5.8 1.7 - 7.7 K/uL   Lymphocytes Relative 7 %   Lymphs Abs 0.5 (L) 0.7 - 4.0 K/uL   Monocytes Relative 5 %    Monocytes Absolute 0.4 0.1 - 1.0 K/uL   Eosinophils Relative 0 %   Eosinophils Absolute 0.0 0.0 - 0.5 K/uL   Basophils Relative 0 %   Basophils Absolute 0.0 0.0 - 0.1 K/uL   Immature Granulocytes 1 %   Abs Immature Granulocytes 0.04 0.00 - 0.07 K/uL  Comprehensive metabolic panel     Status: Abnormal   Collection Time: 02/17/20  7:30 PM  Result Value Ref Range   Sodium 131 (L) 135 - 145 mmol/L   Potassium 3.6 3.5 - 5.1 mmol/L   Chloride 103 98 - 111 mmol/L   CO2 16 (L) 22 - 32 mmol/L   Glucose, Bld 116 (H) 70 - 99 mg/dL   BUN 6 6 - 20 mg/dL   Creatinine, Ser 0.75 0.44 - 1.00 mg/dL   Calcium 8.6 (L) 8.9 - 10.3 mg/dL   Total Protein 6.2 (L) 6.5 - 8.1 g/dL   Albumin 2.9 (L) 3.5 - 5.0 g/dL   AST 106 (H) 15 - 41 U/L   ALT 47 (H) 0 - 44 U/L   Alkaline Phosphatase 91 38 - 126 U/L   Total Bilirubin 1.1 0.3 - 1.2 mg/dL   GFR, Estimated >60 >60 mL/min   Anion gap 12 5 - 15  Troponin I (High Sensitivity)     Status: None   Collection Time: 02/17/20  7:30 PM  Result Value Ref Range   Troponin I (High Sensitivity) 5 <18 ng/L  Lactate dehydrogenase     Status: None   Collection Time: 02/17/20  7:30 PM  Result Value Ref Range   LDH 145 98 - 192 U/L  Urinalysis, Routine w reflex microscopic Nasopharyngeal Swab     Status: Abnormal   Collection Time: 02/17/20  7:50 PM  Result Value Ref Range   Color, Urine YELLOW YELLOW   APPearance CLEAR CLEAR   Specific Gravity, Urine 1.013 1.005 - 1.030   pH 6.0 5.0 - 8.0   Glucose, UA NEGATIVE NEGATIVE mg/dL   Hgb urine dipstick NEGATIVE NEGATIVE   Bilirubin Urine NEGATIVE NEGATIVE   Ketones, ur 80 (A) NEGATIVE mg/dL   Protein, ur NEGATIVE NEGATIVE mg/dL   Nitrite NEGATIVE NEGATIVE   Leukocytes,Ua NEGATIVE NEGATIVE  Protein / creatinine ratio, urine     Status: None   Collection Time: 02/17/20  7:50 PM  Result Value Ref Range   Creatinine, Urine 118.17 mg/dL   Total Protein, Urine 16 mg/dL   Protein Creatinine Ratio 0.14 0.00 - 0.15 mg/mg[Cre]      MDM UA  Upon arrival, patient tachycardic to 150s with hypotension. Patient not vaccinated for COVID and has symptoms consistent with COVID. Will swab for COVID-19  CBC with Diff CMP, BNP, Troponin LR bolus  COVID-19 positive- offered to send message for monoclonal antibodies and patient declined. COVID precautions reviewed and warning signs of when to return discussed. Urgent message sent to clinic to change appointment to virtual or reschedule to outside of her quarantine window.   Consulted with Dr. Rip Harbour regarding CMP and CBC results. Recommended protein/creat ratio and LDH be added on to rule out atypical HELLP.   PCR and LDH within normal limits. Dr. Rip Harbour updated and recommends patient to be discharged and to follow up in clinic as scheduled.   GBS and gc/chlamydia collected tonight  so that appointment can be virtual.  Assessment and Plan   1. COVID-19 affecting pregnancy, antepartum   2. NST (non-stress test) reactive   3. [redacted] weeks gestation of pregnancy   4. Elevated liver enzymes    -Discharge home in stable condition -Rx for flonase sent to patient's [place] and pregnancy precautions discussed -Patient advised to follow-up with OB as scheduled on 1/14 as a virtual visit.  -Patient may return to MAU as needed or if her condition were to change or worsen   Wende Mott [name] 02/17/2020, 9:47 PM

## 2020-02-17 NOTE — Discharge Instructions (Signed)
Safe Medications in Pregnancy   Acne: Benzoyl Peroxide Salicylic Acid  Backache/Headache: Tylenol: 2 regular strength every 4 hours OR              2 Extra strength every 6 hours  Colds/Coughs/Allergies: Benadryl (alcohol free) 25 mg every 6 hours as needed Breath right strips Claritin Cepacol throat lozenges Chloraseptic throat spray Cold-Eeze- up to three times per day Cough drops, alcohol free Flonase (by prescription only) Guaifenesin Mucinex Robitussin DM (plain only, alcohol free) Saline nasal spray/drops Sudafed (pseudoephedrine) & Actifed ** use only after [redacted] weeks gestation and if you do not have high blood pressure Tylenol Vicks Vaporub Zinc lozenges Zyrtec   Constipation: Colace Ducolax suppositories Fleet enema Glycerin suppositories Metamucil Milk of magnesia Miralax Senokot Smooth move tea  Diarrhea: Kaopectate Imodium A-D  *NO pepto Bismol  Hemorrhoids: Anusol Anusol HC Preparation H Tucks  Indigestion: Tums Maalox Mylanta Zantac  Pepcid  Insomnia: Benadryl (alcohol free) 25mg  every 6 hours as needed Tylenol PM Unisom, no Gelcaps  Leg Cramps: Tums MagGel  Nausea/Vomiting:  Bonine Dramamine Emetrol Ginger extract Sea bands Meclizine  Nausea medication to take during pregnancy:  Unisom (doxylamine succinate 25 mg tablets) Take one tablet daily at bedtime. If symptoms are not adequately controlled, the dose can be increased to a maximum recommended dose of two tablets daily (1/2 tablet in the morning, 1/2 tablet mid-afternoon and one at bedtime). Vitamin B6 100mg  tablets. Take one tablet twice a day (up to 200 mg per day).  Skin Rashes: Aveeno products Benadryl cream or 25mg  every 6 hours as needed Calamine Lotion 1% cortisone cream  Yeast infection: Gyne-lotrimin 7 Monistat 7   **If taking multiple medications, please check labels to avoid duplicating the same active ingredients **take medication as directed on  the label ** Do not exceed 4000 mg of tylenol in 24 hours **Do not take medications that contain aspirin or ibuprofen     10 Things You Can Do to Manage Your COVID-19 Symptoms at Home If you have possible or confirmed COVID-19: 1. Stay home from work and school. And stay away from other public places. If you must go out, avoid using any kind of public transportation, ridesharing, or taxis. 2. Monitor your symptoms carefully. If your symptoms get worse, call your healthcare provider immediately. 3. Get rest and stay hydrated. 4. If you have a medical appointment, call the healthcare provider ahead of time and tell them that you have or may have COVID-19. 5. For medical emergencies, call 911 and notify the dispatch personnel that you have or may have COVID-19. 6. Cover your cough and sneezes with a tissue or use the inside of your elbow. 7. Wash your hands often with soap and water for at least 20 seconds or clean your hands with an alcohol-based hand sanitizer that contains at least 60% alcohol. 8. As much as possible, stay in a specific room and away from other people in your home. Also, you should use a separate bathroom, if available. If you need to be around other people in or outside of the home, wear a mask. 9. Avoid sharing personal items with other people in your household, like dishes, towels, and bedding. 10. Clean all surfaces that are touched often, like counters, tabletops, and doorknobs. Use household cleaning sprays or wipes according to the label instructions. [email or web address] 08/10/2018 This information is not intended to replace advice given to you by your health care provider. Make sure you discuss any questions you have with your  health care provider. Document Revised: 01/12/2019 Document Reviewed: 01/12/2019 Elsevier Patient Education  [place].

## 2020-02-17 NOTE — Progress Notes (Signed)
It feeling better, no chest tightness or nausea.

## 2020-02-19 LAB — GC/CHLAMYDIA PROBE AMP (~~LOC~~) NOT AT ARMC
Chlamydia: NEGATIVE
Comment: NEGATIVE
Comment: NORMAL
Neisseria Gonorrhea: NEGATIVE

## 2020-02-19 LAB — CULTURE, BETA STREP (GROUP B ONLY)

## 2020-02-23 ENCOUNTER — Telehealth (INDEPENDENT_AMBULATORY_CARE_PROVIDER_SITE_OTHER): Payer: Medicaid Other | Admitting: Obstetrics & Gynecology

## 2020-02-23 ENCOUNTER — Encounter: Payer: Self-pay | Admitting: Obstetrics & Gynecology

## 2020-02-23 DIAGNOSIS — Z3A37 37 weeks gestation of pregnancy: Secondary | ICD-10-CM

## 2020-02-23 DIAGNOSIS — U071 COVID-19: Secondary | ICD-10-CM

## 2020-02-23 DIAGNOSIS — O98513 Other viral diseases complicating pregnancy, third trimester: Secondary | ICD-10-CM | POA: Insufficient documentation

## 2020-02-23 DIAGNOSIS — Z98891 History of uterine scar from previous surgery: Secondary | ICD-10-CM

## 2020-02-23 DIAGNOSIS — Z3493 Encounter for supervision of normal pregnancy, unspecified, third trimester: Secondary | ICD-10-CM

## 2020-02-23 NOTE — Progress Notes (Signed)
Patient needs visit next week for pelvic cultures. Office registrars notified.   Verita Schneiders, MD

## 2020-02-23 NOTE — Progress Notes (Signed)
8:38a- Called Pt for My Chart visit, no answer, left VM that will call back in 10 to 15 minutes.  8:50a- 2nd attempt, no answer, will need to reschedule.

## 2020-02-23 NOTE — Patient Instructions (Signed)
Return to office for any scheduled appointments. Call the office or go to the MAU at [facility] if:  You begin to have strong, frequent contractions  Your water breaks.  Sometimes it is a big gush of fluid, sometimes it is just a trickle that keeps getting your panties wet or running down your legs  You have vaginal bleeding.  It is normal to have a small amount of spotting if your cervix was checked.   You do not feel your baby moving like normal.  If you do not, get something to eat and drink and lay down and focus on feeling your baby move.   If your baby is still not moving like normal, you should call the office or go to MAU.  Any other obstetric concerns.   

## 2020-02-23 NOTE — Progress Notes (Signed)
    Phone Call Documentation  Called patient to start virtual prenatal visit at 8:25 am but there was no answer. Left voicemail. Will try again later.  Verita Schneiders, MD, [place] [facility] [employer], [place]

## 2020-03-01 ENCOUNTER — Telehealth (INDEPENDENT_AMBULATORY_CARE_PROVIDER_SITE_OTHER): Payer: Medicaid Other | Admitting: Family Medicine

## 2020-03-01 DIAGNOSIS — Z91199 Patient's noncompliance with other medical treatment and regimen due to unspecified reason: Secondary | ICD-10-CM

## 2020-03-01 DIAGNOSIS — Z5329 Procedure and treatment not carried out because of patient's decision for other reasons: Secondary | ICD-10-CM

## 2020-03-01 NOTE — Progress Notes (Signed)
Left messages with pt and on [employer] mobile per DPR. Pt to return call to office for virtual appt today.  Attempted x 3 to reach pt. No answer and no callback.

## 2020-03-01 NOTE — Progress Notes (Signed)
Patient did not keep appointment today. She will be called to reschedule.  

## 2020-03-05 ENCOUNTER — Ambulatory Visit (INDEPENDENT_AMBULATORY_CARE_PROVIDER_SITE_OTHER): Payer: Medicaid Other | Admitting: Family Medicine

## 2020-03-05 ENCOUNTER — Other Ambulatory Visit: Payer: Self-pay

## 2020-03-05 VITALS — BP 119/77 | HR 74 | Wt 236.2 lb

## 2020-03-05 DIAGNOSIS — O99113 Other diseases of the blood and blood-forming organs and certain disorders involving the immune mechanism complicating pregnancy, third trimester: Secondary | ICD-10-CM

## 2020-03-05 DIAGNOSIS — O99210 Obesity complicating pregnancy, unspecified trimester: Secondary | ICD-10-CM

## 2020-03-05 DIAGNOSIS — D696 Thrombocytopenia, unspecified: Secondary | ICD-10-CM

## 2020-03-05 DIAGNOSIS — Z98891 History of uterine scar from previous surgery: Secondary | ICD-10-CM

## 2020-03-05 DIAGNOSIS — U071 COVID-19: Secondary | ICD-10-CM

## 2020-03-05 DIAGNOSIS — O98513 Other viral diseases complicating pregnancy, third trimester: Secondary | ICD-10-CM

## 2020-03-05 DIAGNOSIS — R87810 Cervical high risk human papillomavirus (HPV) DNA test positive: Secondary | ICD-10-CM

## 2020-03-05 DIAGNOSIS — Z3493 Encounter for supervision of normal pregnancy, unspecified, third trimester: Secondary | ICD-10-CM

## 2020-03-05 NOTE — Progress Notes (Signed)
   PRENATAL VISIT NOTE  Subjective:  [name] is a 30 y.o. O2V0350 at [redacted]w[redacted]d being seen today for ongoing prenatal care.  She is currently monitored for the following issues for this low-risk pregnancy and has Supervision of low-risk pregnancy; History of VBAC x 2; Cervical high risk human papillomavirus (HPV) DNA test positive; Obesity in pregnancy, antepartum; Gestational thrombocytopenia ([place]); and COVID-19 affecting pregnancy in third trimester on their problem list.  Patient reports pelvic pressure, constant. Denies contractions.  Contractions: Irritability. Vag. Bleeding: None.  Movement: Present. Denies leaking of fluid.   The following portions of the patient's history were reviewed and updated as appropriate: allergies, current medications, past family history, past medical history, past social history, past surgical history and problem list.   Objective:   Vitals:   03/05/20 1101  BP: 119/77  Pulse: 74  Weight: 236 lb 3.2 oz (107.1 kg)    Fetal Status: Fetal Heart Rate (bpm): 140   Movement: Present     General:  Alert, oriented and cooperative. Patient is in no acute distress.  Skin: Skin is warm and dry. No rash noted.   Cardiovascular: Normal heart rate noted  Respiratory: Normal respiratory effort, no problems with respiration noted  Abdomen: Soft, gravid, appropriate for gestational age.  Pain/Pressure: Present     Pelvic: Cervical exam performed in the presence of a chaperone      0/93/-8 cephalic  Extremities: Normal range of motion.  Edema: None  Mental Status: Normal mood and affect. Normal behavior. Normal judgment and thought content.   Assessment and Plan:  Pregnancy: [id number] at [redacted]w[redacted]d 1. Encounter for supervision of low-risk pregnancy in third trimester -doing well without complaints, VSS -taking PNV -desires IP nexplanon for contraception -schedule PD-IOL for 41w if not delivered at next visit -cervical exam performed today per patient request  2.  History of VBAC x 2 -Desires TOLAC, consent signed 12/22/19  3. Cervical high risk human papillomavirus (HPV) DNA test positive -pap 08/23/19 HRHPV+, not 16/18/45, NILM, per ASCCP guidelines repeat pap due 08/2020, patient notified  4. Obesity in pregnancy, antepartum -BMI 35, taking bASA  5. Benign gestational thrombocytopenia in third trimester ([place]) -noted 12/22/19 plt 146, stable 02/17/20 plt 148, will recheck cbc on admit  6. COVID-19 affecting pregnancy in third trimester -symptomatic, diagnosed 02/17/20 -doing well without complaints, counseled on vaccination postpartum -does not need COVID testing or airborne precautions on admission to L&D  Term labor symptoms and general obstetric precautions including but not limited to vaginal bleeding, contractions, leaking of fluid and fetal movement were reviewed in detail with the patient. Please refer to After Visit Summary for other counseling recommendations.   Return in about 1 week (around 03/12/2020) for LROB, in person.  Future Appointments  Date Time Provider [place]  03/13/2020 10:55 AM Clarnce Flock, MD [facility]  03/20/2020  8:55 AM Clarnce Flock, MD [facility]    Arrie Senate, MD

## 2020-03-10 ENCOUNTER — Encounter (HOSPITAL_COMMUNITY): Payer: Self-pay | Admitting: Obstetrics & Gynecology

## 2020-03-10 ENCOUNTER — Inpatient Hospital Stay (HOSPITAL_COMMUNITY)
Admission: AD | Admit: 2020-03-10 | Discharge: 2020-03-10 | Disposition: A | Discharge status: Home / Self Care | Payer: Medicaid Other | Source: Home / Self Care | Attending: Obstetrics & Gynecology | Admitting: Obstetrics & Gynecology

## 2020-03-10 ENCOUNTER — Other Ambulatory Visit: Payer: Self-pay

## 2020-03-10 DIAGNOSIS — Z3A00 Weeks of gestation of pregnancy not specified: Secondary | ICD-10-CM | POA: Insufficient documentation

## 2020-03-10 DIAGNOSIS — O479 False labor, unspecified: Secondary | ICD-10-CM

## 2020-03-10 NOTE — MAU Note (Signed)
Lost her mucous plug yesterday, started having some bloody show this morning & contractions 5-7 mins.  Denies LOF.  + FM 3/50 last week in the office.

## 2020-03-11 ENCOUNTER — Inpatient Hospital Stay (HOSPITAL_COMMUNITY): Payer: Medicaid Other | Admitting: Anesthesiology

## 2020-03-11 ENCOUNTER — Inpatient Hospital Stay (HOSPITAL_COMMUNITY)
Admission: AD | Admit: 2020-03-11 | Discharge: 2020-03-13 | DRG: 807 | Disposition: A | Discharge status: Home / Self Care | Payer: Medicaid Other | Attending: Family Medicine | Admitting: Family Medicine

## 2020-03-11 ENCOUNTER — Encounter (HOSPITAL_COMMUNITY): Payer: Self-pay | Admitting: Obstetrics & Gynecology

## 2020-03-11 DIAGNOSIS — Z98891 History of uterine scar from previous surgery: Secondary | ICD-10-CM

## 2020-03-11 DIAGNOSIS — E669 Obesity, unspecified: Secondary | ICD-10-CM | POA: Diagnosis present

## 2020-03-11 DIAGNOSIS — Z30017 Encounter for initial prescription of implantable subdermal contraceptive: Secondary | ICD-10-CM | POA: Diagnosis not present

## 2020-03-11 DIAGNOSIS — Z3A39 39 weeks gestation of pregnancy: Secondary | ICD-10-CM

## 2020-03-11 DIAGNOSIS — O9912 Other diseases of the blood and blood-forming organs and certain disorders involving the immune mechanism complicating childbirth: Principal | ICD-10-CM | POA: Diagnosis present

## 2020-03-11 DIAGNOSIS — O99214 Obesity complicating childbirth: Secondary | ICD-10-CM | POA: Diagnosis present

## 2020-03-11 DIAGNOSIS — O34219 Maternal care for unspecified type scar from previous cesarean delivery: Secondary | ICD-10-CM | POA: Diagnosis present

## 2020-03-11 DIAGNOSIS — Z8616 Personal history of COVID-19: Secondary | ICD-10-CM | POA: Diagnosis not present

## 2020-03-11 DIAGNOSIS — U071 Other viral diseases complicating pregnancy, third trimester: Secondary | ICD-10-CM

## 2020-03-11 DIAGNOSIS — D6959 Other secondary thrombocytopenia: Secondary | ICD-10-CM | POA: Diagnosis present

## 2020-03-11 DIAGNOSIS — Z87891 Personal history of nicotine dependence: Secondary | ICD-10-CM | POA: Diagnosis not present

## 2020-03-11 DIAGNOSIS — O26893 Other specified pregnancy related conditions, third trimester: Secondary | ICD-10-CM | POA: Diagnosis present

## 2020-03-11 DIAGNOSIS — O98513 Other viral diseases complicating pregnancy, third trimester: Principal | ICD-10-CM

## 2020-03-11 DIAGNOSIS — D696 Thrombocytopenia, unspecified: Secondary | ICD-10-CM

## 2020-03-11 LAB — CBC
HCT: 36 % (ref 36.0–46.0)
Hemoglobin: 12.4 g/dL (ref 12.0–15.0)
MCH: 28.4 pg (ref 26.0–34.0)
MCHC: 34.4 g/dL (ref 30.0–36.0)
MCV: 82.4 fL (ref 80.0–100.0)
Platelets: 206 10*3/uL (ref 150–400)
RBC: 4.37 MIL/uL (ref 3.87–5.11)
RDW: 15.5 % (ref 11.5–15.5)
WBC: 7.1 10*3/uL (ref 4.0–10.5)
nRBC: 0 % (ref 0.0–0.2)

## 2020-03-11 LAB — TYPE AND SCREEN
ABO/RH(D): O POS
Antibody Screen: NEGATIVE

## 2020-03-11 LAB — RPR: RPR Ser Ql: NONREACTIVE

## 2020-03-11 MED ORDER — FENTANYL CITRATE (PF) 100 MCG/2ML IJ SOLN: 100.0000 ug | INTRAMUSCULAR | Status: DC | PRN

## 2020-03-11 MED ORDER — OXYCODONE HCL 5 MG PO TABS
5.0000 mg | ORAL_TABLET | Freq: Once | ORAL | Status: AC
  Administered 2020-03-11: 5 mg via ORAL
  Filled 2020-03-11 (×2): qty 1

## 2020-03-11 MED ORDER — LACTATED RINGERS IV SOLN: 500.0000 mL | Freq: Once | INTRAVENOUS | Status: DC

## 2020-03-11 MED ORDER — PHENYLEPHRINE 40 MCG/ML (10ML) SYRINGE FOR IV PUSH (FOR BLOOD PRESSURE SUPPORT)
80.0000 ug | PREFILLED_SYRINGE | INTRAVENOUS | Status: DC | PRN
  Administered 2020-03-11 (×2): 80 ug via INTRAVENOUS

## 2020-03-11 MED ORDER — WITCH HAZEL-GLYCERIN EX PADS: 1.0000 "application " | MEDICATED_PAD | CUTANEOUS | Status: DC | PRN

## 2020-03-11 MED ORDER — ONDANSETRON HCL 4 MG PO TABS: 4.0000 mg | ORAL_TABLET | ORAL | Status: DC | PRN

## 2020-03-11 MED ORDER — TETANUS-DIPHTH-ACELL PERTUSSIS 5-2.5-18.5 LF-MCG/0.5 IM SUSY: 0.5000 mL | PREFILLED_SYRINGE | Freq: Once | INTRAMUSCULAR | Status: DC

## 2020-03-11 MED ORDER — SENNOSIDES-DOCUSATE SODIUM 8.6-50 MG PO TABS
2.0000 | ORAL_TABLET | ORAL | Status: DC
  Administered 2020-03-12 – 2020-03-13 (×2): 2 via ORAL
  Filled 2020-03-11 (×2): qty 2

## 2020-03-11 MED ORDER — FENTANYL-BUPIVACAINE-NACL 0.5-0.125-0.9 MG/250ML-% EP SOLN
12.0000 mL/h | EPIDURAL | Status: DC | PRN
  Administered 2020-03-11: 12 mL/h via EPIDURAL
  Filled 2020-03-11: qty 250

## 2020-03-11 MED ORDER — OXYCODONE-ACETAMINOPHEN 5-325 MG PO TABS: 2.0000 | ORAL_TABLET | ORAL | Status: DC | PRN

## 2020-03-11 MED ORDER — EPHEDRINE 5 MG/ML INJ: 10.0000 mg | INTRAVENOUS | Status: DC | PRN

## 2020-03-11 MED ORDER — COCONUT OIL OIL: 1.0000 "application " | TOPICAL_OIL | Status: DC | PRN

## 2020-03-11 MED ORDER — DIBUCAINE (PERIANAL) 1 % EX OINT: 1.0000 "application " | TOPICAL_OINTMENT | CUTANEOUS | Status: DC | PRN

## 2020-03-11 MED ORDER — OXYCODONE-ACETAMINOPHEN 5-325 MG PO TABS: 1.0000 | ORAL_TABLET | ORAL | Status: DC | PRN

## 2020-03-11 MED ORDER — SOD CITRATE-CITRIC ACID 500-334 MG/5ML PO SOLN: 30.0000 mL | ORAL | Status: DC | PRN

## 2020-03-11 MED ORDER — LACTATED RINGERS IV SOLN: INTRAVENOUS | Status: DC

## 2020-03-11 MED ORDER — BENZOCAINE-MENTHOL 20-0.5 % EX AERO: 1.0000 "application " | INHALATION_SPRAY | CUTANEOUS | Status: DC | PRN

## 2020-03-11 MED ORDER — OXYTOCIN-SODIUM CHLORIDE 30-0.9 UT/500ML-% IV SOLN
2.5000 [IU]/h | INTRAVENOUS | Status: DC
  Filled 2020-03-11: qty 500

## 2020-03-11 MED ORDER — ONDANSETRON HCL 4 MG/2ML IJ SOLN: 4.0000 mg | Freq: Four times a day (QID) | INTRAMUSCULAR | Status: DC | PRN

## 2020-03-11 MED ORDER — SIMETHICONE 80 MG PO CHEW: 80.0000 mg | CHEWABLE_TABLET | ORAL | Status: DC | PRN

## 2020-03-11 MED ORDER — PHENYLEPHRINE 40 MCG/ML (10ML) SYRINGE FOR IV PUSH (FOR BLOOD PRESSURE SUPPORT)
80.0000 ug | PREFILLED_SYRINGE | INTRAVENOUS | Status: DC | PRN
  Filled 2020-03-11: qty 10

## 2020-03-11 MED ORDER — ZOLPIDEM TARTRATE 5 MG PO TABS: 5.0000 mg | ORAL_TABLET | Freq: Every evening | ORAL | Status: DC | PRN

## 2020-03-11 MED ORDER — ACETAMINOPHEN 500 MG PO TABS
1000.0000 mg | ORAL_TABLET | Freq: Once | ORAL | Status: AC
  Administered 2020-03-11: 1000 mg via ORAL
  Filled 2020-03-11: qty 2

## 2020-03-11 MED ORDER — LIDOCAINE HCL (PF) 1 % IJ SOLN
INTRAMUSCULAR | Status: DC | PRN
  Administered 2020-03-11 (×2): 4 mL via EPIDURAL

## 2020-03-11 MED ORDER — IBUPROFEN 600 MG PO TABS
600.0000 mg | ORAL_TABLET | Freq: Four times a day (QID) | ORAL | Status: DC
  Administered 2020-03-11 – 2020-03-13 (×8): 600 mg via ORAL
  Filled 2020-03-11 (×8): qty 1

## 2020-03-11 MED ORDER — LACTATED RINGERS IV SOLN: 500.0000 mL | INTRAVENOUS | Status: DC | PRN

## 2020-03-11 MED ORDER — ACETAMINOPHEN 325 MG PO TABS
650.0000 mg | ORAL_TABLET | ORAL | Status: DC | PRN
  Administered 2020-03-11: 650 mg via ORAL
  Filled 2020-03-11: qty 2

## 2020-03-11 MED ORDER — PRENATAL MULTIVITAMIN CH
1.0000 | ORAL_TABLET | Freq: Every day | ORAL | Status: DC
  Administered 2020-03-12: 1 via ORAL
  Filled 2020-03-11: qty 1

## 2020-03-11 MED ORDER — DIPHENHYDRAMINE HCL 25 MG PO CAPS: 25.0000 mg | ORAL_CAPSULE | Freq: Four times a day (QID) | ORAL | Status: DC | PRN

## 2020-03-11 MED ORDER — OXYTOCIN BOLUS FROM INFUSION
333.0000 mL | Freq: Once | INTRAVENOUS | Status: AC
  Administered 2020-03-11: 333 mL via INTRAVENOUS

## 2020-03-11 MED ORDER — ONDANSETRON HCL 4 MG/2ML IJ SOLN: 4.0000 mg | INTRAMUSCULAR | Status: DC | PRN

## 2020-03-11 MED ORDER — ACETAMINOPHEN 325 MG PO TABS: 650.0000 mg | ORAL_TABLET | ORAL | Status: DC | PRN

## 2020-03-11 MED ORDER — DIPHENHYDRAMINE HCL 50 MG/ML IJ SOLN
12.5000 mg | INTRAMUSCULAR | Status: DC | PRN
  Administered 2020-03-11 (×2): 12.5 mg via INTRAVENOUS
  Filled 2020-03-11: qty 1

## 2020-03-11 MED ORDER — LIDOCAINE HCL (PF) 1 % IJ SOLN: 30.0000 mL | INTRAMUSCULAR | Status: DC | PRN

## 2020-03-11 NOTE — Anesthesia Procedure Notes (Signed)
Epidural Patient location during procedure: OB Start time: 03/11/2020 4:09 AM End time: 03/11/2020 4:17 AM  Staffing Anesthesiologist: Josephine Igo, MD Performed: anesthesiologist   Preanesthetic Checklist Completed: patient identified, IV checked, site marked, risks and benefits discussed, surgical consent, monitors and equipment checked, pre-op evaluation and timeout performed  Epidural Patient position: sitting Prep: DuraPrep and site prepped and draped Patient monitoring: continuous pulse ox and blood pressure Approach: midline Location: L4-L5 Injection technique: LOR air  Needle:  Needle type: Tuohy  Needle gauge: 17 G Needle length: 9 cm and 9 Needle insertion depth: 6 cm Catheter type: closed end flexible Catheter size: 19 Gauge Catheter at skin depth: 11 cm Test dose: negative and Other  Assessment Events: blood not aspirated, injection not painful, no injection resistance, no paresthesia and negative IV test  Additional Notes Patient identified. Risks and benefits discussed including failed block, incomplete  Pain control, post dural puncture headache, nerve damage, paralysis, blood pressure Changes, nausea, vomiting, reactions to medications-both toxic and allergic and post Partum back pain. All questions were answered. Patient expressed understanding and wished to proceed. Sterile technique was used throughout procedure. Epidural site was Dressed with sterile barrier dressing. No paresthesias, signs of intravascular injection Or signs of intrathecal spread were encountered.  Patient was more comfortable after the epidural was dosed. Please see RN's note for documentation of vital signs and FHR which are stable. Reason for block:procedure for pain

## 2020-03-11 NOTE — Progress Notes (Signed)
Urethral cathether was not charted that it was placed.  Removed at 1321, intact.

## 2020-03-11 NOTE — Progress Notes (Signed)
During bedside shift report, patient complained of increased lower abdominal pain. Per dayshift nurse, she received 1800 dose of ibuprofen with no relief. Spoke with Scherrie Bateman and orders were given to give patient PRN Tylenol and if she still does not have relief, she was fine with an order being placed for one time dose of PRN oxy.

## 2020-03-11 NOTE — Anesthesia Preprocedure Evaluation (Signed)
Anesthesia Evaluation  Patient identified by MRN, date of birth, ID band Patient awake    Reviewed: Allergy & Precautions, Patient's Chart, lab work & pertinent test results  Airway Mallampati: II  TM Distance: >3 FB Neck ROM: Full    Dental no notable dental hx. (+) Teeth Intact   Pulmonary former smoker,  Covid-19 + on 02/17/20, asymptomatic now   Pulmonary exam normal breath sounds clear to auscultation       Cardiovascular negative cardio ROS Normal cardiovascular exam Rhythm:Regular Rate:Normal     Neuro/Psych negative neurological ROS  negative psych ROS   GI/Hepatic Neg liver ROS, GERD  ,  Endo/Other  Obesity  Renal/GU negative Renal ROS  negative genitourinary   Musculoskeletal negative musculoskeletal ROS (+)   Abdominal (+) + obese,   Peds  Hematology negative hematology ROS (+)   Anesthesia Other Findings   Reproductive/Obstetrics (+) Pregnancy                             Anesthesia Physical Anesthesia Plan  ASA: II  Anesthesia Plan: Epidural   Post-op Pain Management:    Induction:   PONV Risk Score and Plan:   Airway Management Planned: Natural Airway  Additional Equipment:   Intra-op Plan:   Post-operative Plan:   Informed Consent: I have reviewed the patients History and Physical, chart, labs and discussed the procedure including the risks, benefits and alternatives for the proposed anesthesia with the patient or authorized representative who has indicated his/her understanding and acceptance.       Plan Discussed with: Anesthesiologist  Anesthesia Plan Comments:         Anesthesia Quick Evaluation

## 2020-03-11 NOTE — MAU Note (Addendum)
...  [name] is a 30 y.o. at [redacted]w[redacted]d here in MAU reporting: CTX that increased in intensity and severity around 2200 last night. CTX 3-7 minutes apart. Patient was seen yesterday in MAU and was sent home at 1400. Last SVE 3cm. +FM. No VB or LOF. Patient reports losing her mucous plug.  Pain score: 8/10 lower abdomen and lower back.  GBS-

## 2020-03-11 NOTE — Discharge Summary (Signed)
Postpartum Discharge Summary  Date of Service updated 03/13/20     Patient Name: [name] DOB: [birth year] MRN: [id number]  Date of admission: 03/11/2020 Delivery date:03/11/2020  Delivering provider: Gladys Damme  Date of discharge: 03/13/2020  Admitting diagnosis: Supervision of high risk pregnancy, antepartum [O09.90] Intrauterine pregnancy: [redacted]w[redacted]d    Secondary diagnosis:  Active Problems:   VBAC (vaginal birth after Cesarean)  Additional problems: none    Discharge diagnosis: Term Pregnancy Delivered and VBAC                                              Post partum procedures:PP Nexplanon inserted Augmentation: AROM Complications: None  Hospital course: Onset of Labor With Vaginal Delivery      30y.o. yo [id number] admitted in Latent Labor on 03/11/2020. Patient had an uncomplicated labor course as follows:  Membrane Rupture Time/Date: 6:49 AM ,03/11/2020   Delivery Method:VBAC, Spontaneous  Episiotomy: None  Lacerations:  Labial  Patient had an uncomplicated postpartum course.  She is ambulating, tolerating a regular diet, passing flatus, and urinating well. Patient is discharged home in stable condition on 03/13/20.  Newborn Data: Birth date:03/11/2020  Birth time:11:36 AM  Gender:Female  Living status:Living  Apgars:9 ,9  Weight:3620 g   Magnesium Sulfate received: No BMZ received: No Rhophylac:N/A MMR:N/A T-DaP:Given prenatally Flu: No Transfusion:No  Physical exam  Vitals:   03/12/20 0217 03/12/20 0601 03/12/20 1443 03/12/20 1957  BP: 117/77 121/68 126/73 125/83  Pulse: (!) 57 61 63 60  Resp:  15 17 16   Temp: 97.6 F (36.4 C) 98.2 F (36.8 C) 97.7 F (36.5 C) 98.6 F (37 C)  TempSrc: Oral Oral Oral Oral  SpO2: 97% 97% 97% 99%   General: alert, cooperative and no distress Lochia: appropriate Uterine Fundus: firm Incision: N/A DVT Evaluation: No evidence of DVT seen on physical exam. Labs: Lab Results  Component Value Date    WBC 7.1 03/11/2020   HGB 12.4 03/11/2020   HCT 36.0 03/11/2020   MCV 82.4 03/11/2020   PLT 206 03/11/2020   CMP Latest Ref Rng & Units 02/17/2020  Glucose 70 - 99 mg/dL 116(H)  BUN 6 - 20 mg/dL 6  Creatinine 0.44 - 1.00 mg/dL 0.75  Sodium 135 - 145 mmol/L 131(L)  Potassium 3.5 - 5.1 mmol/L 3.6  Chloride 98 - 111 mmol/L 103  CO2 22 - 32 mmol/L 16(L)  Calcium 8.9 - 10.3 mg/dL 8.6(L)  Total Protein 6.5 - 8.1 g/dL 6.2(L)  Total Bilirubin 0.3 - 1.2 mg/dL 1.1  Alkaline Phos 38 - 126 U/L 91  AST 15 - 41 U/L 106(H)  ALT 0 - 44 U/L 47(H)   EdFlavia Shippercore: Edinburgh Postnatal Depression Scale Screening Tool 03/11/2020  I have been able to laugh and see the funny side of things. 0  I have looked forward with enjoyment to things. 0  I have blamed myself unnecessarily when things went wrong. 0  I have been anxious or worried for no good reason. 0  I have felt scared or panicky for no good reason. 0  Things have been getting on top of me. 0  I have been so unhappy that I have had difficulty sleeping. 0  I have felt sad or miserable. 0  I have been so unhappy that I have been crying. 0  The thought of  harming myself has occurred to me. 0  Edinburgh Postnatal Depression Scale Total 0     After visit meds:  Allergies as of 03/13/2020   No Known Allergies     Medication List    STOP taking these medications   acetaminophen 500 MG tablet Commonly known as: TYLENOL   aspirin EC 81 MG tablet     TAKE these medications   fluticasone 50 MCG/ACT nasal spray Commonly known as: FLONASE Place 2 sprays into both nostrils daily for 10 days.   ibuprofen 600 MG tablet Commonly known as: ADVIL Take 1 tablet (600 mg total) by mouth every 6 (six) hours.   multivitamin-prenatal 27-0.8 MG Tabs tablet Take 1 tablet daily at 12 noon by mouth.        Discharge home in stable condition Infant Feeding: Bottle and Breast Infant Disposition:home with mother Discharge instruction: per After  Visit Summary and Postpartum booklet. Activity: Advance as tolerated. Pelvic rest for 6 weeks.  Diet: routine diet Future Appointments: No future appointments. Follow up Visit:  [place] [facility] [place] [facility] Follow up in 4 week(s).   Specialty: Obstetrics and Gynecology Why: someone will call you with an appointment Contact information: [place] [id number] [phone]               Please schedule this patient for a In person postpartum visit in 6 weeks with the following provider: Any provider. Additional Postpartum F/U:n/a  Low risk pregnancy complicated by: hx of CS Delivery mode:  VBAC, Spontaneous  Anticipated Birth Control:  PP Nexplanon placed     03/13/2020 Hansel Feinstein, [name]

## 2020-03-11 NOTE — H&P (Signed)
OBSTETRIC ADMISSION HISTORY AND PHYSICAL  [name] is a 30 y.o. female [phone] with IUP at [redacted]w[redacted]d by 6 week ultrasound presenting for spontaneous onset of labor. She reports +FMs, No LOF, no VB, no blurry vision, headaches or peripheral edema, and RUQ pain.  She plans on breast and bottle feeding. She requests inpatient Nexplanon for birth control. She received her prenatal care at [place]: By 6 week ultrasound --->  Estimated Date of Delivery: 03/13/20  Sono:  @[redacted]w[redacted]d , CWD, normal anatomy, variable presentation, 576g, 40% EFW   Prenatal History/Complications:  - h/o COVID in pregnancy (diagnosed 02/17/20) - h/o Cesarean x1 (fetal intolerance of labor) >h/o 2 successful VBACs  Past Medical History: Past Medical History:  Diagnosis Date  . No pertinent past medical history     Past Surgical History: Past Surgical History:  Procedure Laterality Date  . 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 CHOLECYSTECTOMY    . NO PAST SURGERIES    . 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 Marital status: Single    Spouse name: Not on file  . Number of children: Not on file  . Years of education: Not on file  . Highest education level: Not on file  Occupational History  . Not on file  Tobacco Use  . Smoking status: Former Smoker    Packs/day: 0.25    Types: Cigarettes  . Smokeless tobacco: Former Systems developer    Quit date: 06/29/2019  Substance and Sexual Activity  . Alcohol use: Yes    Comment: occ  . Drug use: Yes    Types: Marijuana     Comment: occ  . Sexual activity: Yes  Other Topics Concern  . Not on file  Social History Narrative  . Not on file   Social Determinants of Health   Financial Resource Strain: Not on file  Food Insecurity: Food Insecurity Present  . Worried About Charity fundraiser in the Last Year: Sometimes true  . Ran Out of Food in the Last Year: Never true  Transportation Needs: No Transportation Needs  . Lack of Transportation (Medical): No  . Lack of Transportation (Non-Medical): No  Physical Activity: Not on file  Stress: Not on file  Social Connections: Not on file    Family History: Family History  Problem Relation Age of Onset  . Down syndrome Other        maternal half-nephew  . Diabetes Mother   . Diabetes Maternal Grandmother   . Diabetes Paternal Grandmother   . Other Neg Hx     Allergies: No Known Allergies  Medications Prior to Admission  Medication Sig Dispense Refill Last Dose  . acetaminophen (TYLENOL) 500 MG tablet Take 500 mg by mouth every 6 (six)  hours as needed.   03/10/2020 at Unknown time  . aspirin EC 81 MG tablet Take 1 tablet (81 mg total) by mouth daily. (Patient not taking: Reported on 03/05/2020) 60 tablet 2   . fluticasone (FLONASE) 50 MCG/ACT nasal spray Place 2 sprays into both nostrils daily for 10 days. 15.8 mL 0   . Prenatal Vit-Fe Fumarate-FA (MULTIVITAMIN-PRENATAL) 27-0.8 MG TABS tablet Take 1 tablet daily at 12 noon by mouth. 100 tablet 3      Review of Systems   All systems reviewed and negative except as stated in HPI  Blood pressure 114/75, pulse 96, temperature (!) 97.5 F (36.4 C), temperature source Oral, SpO2 99 %, unknown if currently breastfeeding. General appearance: alert, cooperative and appears stated age Lungs: normal WOB Heart: regular rate Abdomen: soft, non-tender Extremities: no sign of DVT Presentation: cephalic Fetal monitoringBaseline: 120 bpm, Variability: Good {> 6 bpm), Accelerations: Reactive and Decelerations:  Absent Uterine activity: irregular Dilation: 4 Effacement (%): 80 Station: -2 Exam by:: Brittany Ned, RN  Prenatal labs: ABO, Rh: O/Positive/-- (07/14 0934) Antibody: Negative (07/14 0934) Rubella: 1.61 (07/14 0934) RPR: Non Reactive (11/12 0937)  HBsAg: Negative (07/14 0934)  HIV: Non Reactive (11/12 0937)  GBS:   negative 2 hr Glucola wnl Genetic screening  wnl Anatomy US wnl  Prenatal Transfer Tool  Maternal Diabetes: No Genetic Screening: Normal Maternal Ultrasounds/Referrals: Normal Fetal Ultrasounds or other Referrals:  None Maternal Substance Abuse:  No Significant Maternal Medications:  None Significant Maternal Lab Results: Group B Strep negative  No results found for this or any previous visit (from the past 24 hour(s)).  Patient Active Problem List   Diagnosis Date Noted  . COVID-19 affecting pregnancy in third trimester 02/23/2020  . Gestational thrombocytopenia ([place]) 12/23/2019  . Obesity in pregnancy, antepartum 09/21/2019  . Cervical high risk human papillomavirus (HPV) DNA test positive 09/03/2019  . Supervision of low-risk pregnancy 08/09/2019  . History of VBAC x 2 08/09/2019    Assessment/Plan:  [name] is a 30 y.o. [id number] at [redacted]w[redacted]d here for spontaneous onset of labor.  #Labor  TOLAC: Will manage expectantly and augment as clinically indicated. Reviewed risks/benefits of TOLAC versus RCS in detail. Patient counseled regarding potential vaginal delivery, chance of success, future implications, possible uterine rupture and need for urgent/emergent repeat cesarean. Counseled regarding potential need for repeat c-section for reasons unrelated to first c-section. Counseled regarding scheduled repeat cesarean including risks of bleeding, infection, damage to surrounding tissue, abnormal placentation, implications for future pregnancies. All questions answered.  Patient desires TOLAC, consent signed 12/22/19.  #Pain: TBD per pt request #FWB:   Category 1 strip #ID: GBS negative #MOF: breast & bottle #MOC: inpatient Nexplanon desired #Circ: n/a #Gestational Thrombocytopenia: f/u CBC on admission.  Brittany Ngo, MD OB Fellow, Faculty [facility] 03/11/2020 2:48 AM

## 2020-03-12 DIAGNOSIS — Z30017 Encounter for initial prescription of implantable subdermal contraceptive: Secondary | ICD-10-CM

## 2020-03-12 MED ORDER — LIDOCAINE HCL 1 % IJ SOLN
0.0000 mL | Freq: Once | INTRAMUSCULAR | Status: DC | PRN
  Filled 2020-03-12: qty 20

## 2020-03-12 MED ORDER — ETONOGESTREL 68 MG ~~LOC~~ IMPL
68.0000 mg | DRUG_IMPLANT | Freq: Once | SUBCUTANEOUS | Status: AC
  Administered 2020-03-12: 68 mg via SUBCUTANEOUS
  Filled 2020-03-12: qty 1

## 2020-03-12 MED ORDER — ACETAMINOPHEN 500 MG PO TABS
1000.0000 mg | ORAL_TABLET | Freq: Four times a day (QID) | ORAL | Status: DC | PRN
  Administered 2020-03-12 – 2020-03-13 (×5): 1000 mg via ORAL
  Filled 2020-03-12 (×5): qty 2

## 2020-03-12 NOTE — Procedures (Signed)
GYNECOLOGY CLINIC PROCEDURE NOTE  [name] is a 30 y.o. [phone] receiving Nexplanon insertion s/p VBAC.   Nexplanon Insertion Procedure Patient was given informed consent, she signed consent form.  Patient does understand that irregular bleeding is a very common side effect of this medication. Appropriate time out taken.  Patient's left arm was prepped and draped in the usual sterile fashion.  Patient was prepped with alcohol swab and then injected with 2 ml of 1% lidocaine.  She was prepped with betadine, Nexplanon removed from packaging,  Device confirmed in needle, then inserted full length of needle and withdrawn per handbook instructions. Nexplanon was able to palpated in the patient's arm; patient palpated the insert herself. There was minimal blood loss.  Patient insertion site covered with guaze and a pressure bandage to reduce any bruising.  The patient tolerated the procedure well and was given post procedure instructions.    Sonia Side, DO

## 2020-03-12 NOTE — Progress Notes (Signed)
POSTPARTUM PROGRESS NOTE  Subjective: [name] is a 30 y.o. [id number] s/p VBAC at [redacted]w[redacted]d. She reports she doing well. No acute events overnight. She denies any problems with ambulating, voiding or po intake. Denies nausea or vomiting. She has  passed flatus. Pain is moderately controlled, does endorse some pain from contractions.  Lochia is minimal.  Objective: Blood pressure 121/68, pulse 61, temperature 98.2 F (36.8 C), temperature source Oral, resp. rate 15, SpO2 97 %, unknown if currently breastfeeding.  Physical Exam:  General: alert, cooperative and no distress Chest: no respiratory distress Abdomen: soft, non-tender  Uterine Fundus: firm and at level of umbilicus Extremities: No calf swelling or tenderness  no edema  Recent Labs    03/11/20 0243  HGB 12.4  HCT 36.0    Assessment/Plan: [name] is a 29 y.o. [id number] s/p VBAC at [redacted]w[redacted]d.  Routine Postpartum Care: Doing well, pain well-controlled.  -- Continue routine care, lactation support  -- Contraception: Nexplanon inserted -- Feeding: breast & bottle  Dispo: Plan for discharge tomorrow, 2/2  Shary Key, DO 03/12/2020 12:37 PM

## 2020-03-12 NOTE — Anesthesia Postprocedure Evaluation (Signed)
Anesthesia Post Note  Patient: Immunologist  Procedure(s) Performed: AN AD HOC LABOR EPIDURAL     Patient location during evaluation: Mother Baby Anesthesia Type: Epidural Level of consciousness: awake, awake and alert and oriented Pain management: pain level controlled Vital Signs Assessment: post-procedure vital signs reviewed and stable Respiratory status: spontaneous breathing and respiratory function stable Cardiovascular status: blood pressure returned to baseline Postop Assessment: no headache, epidural receding, patient able to bend at knees, adequate PO intake, no backache, no apparent nausea or vomiting and able to ambulate Anesthetic complications: no   No complications documented.  Last Vitals:  Vitals:   03/12/20 0217 03/12/20 0601  BP: 117/77 121/68  Pulse: (!) 57 61  Resp:  15  Temp: 36.4 C 36.8 C  SpO2: 97% 97%    Last Pain:  Vitals:   03/12/20 0601  TempSrc: Oral  PainSc:    Pain Goal: Patients Stated Pain Goal: 3 (03/11/20 2155)                 Bufford Spikes

## 2020-03-12 NOTE — Progress Notes (Signed)
Nexplanon inserted without difficulty. Toya Smothers, RN

## 2020-03-12 NOTE — Progress Notes (Signed)
Patient complaining of lower abdominal cramping and pain rated a 7 on numeric pain scale. Requested that nurse call physician for another order for oxycodone. Called Dr. Sylvester Harder and she denied request for oxy, but gave verbal modification order for Tylenol 1,000 mg.

## 2020-03-13 ENCOUNTER — Encounter: Payer: Medicaid Other | Admitting: Family Medicine

## 2020-03-13 MED ORDER — IBUPROFEN 600 MG PO TABS: 600.0000 mg | ORAL_TABLET | Freq: Four times a day (QID) | ORAL | 0 refills | Status: DC

## 2020-03-13 NOTE — Discharge Instructions (Signed)

## 2020-03-20 ENCOUNTER — Encounter: Payer: Medicaid Other | Admitting: Family Medicine

## 2020-04-16 ENCOUNTER — Encounter: Payer: Self-pay | Admitting: Family Medicine

## 2020-04-16 ENCOUNTER — Telehealth (INDEPENDENT_AMBULATORY_CARE_PROVIDER_SITE_OTHER): Payer: Medicaid Other | Admitting: Family Medicine

## 2020-04-16 DIAGNOSIS — Z8759 Personal history of other complications of pregnancy, childbirth and the puerperium: Secondary | ICD-10-CM

## 2020-04-16 NOTE — Progress Notes (Signed)
     I connected with [name] on 04/16/20 at 1022 by: Mychart video and verified that I am speaking with the correct person using two identifiers.  Patient is located at home and provider is located at [employer] for [employer] [facility] [facility] .     The purpose of this virtual visit is to provide medical care while limiting exposure to the novel coronavirus. I discussed the limitations, risks, security and privacy concerns of performing an evaluation and management service by Mychart and the availability of in person appointments. I also discussed with the patient that there may be a patient responsible charge related to this service. By engaging in this virtual visit, you consent to the provision of healthcare.  Additionally, you authorize for your insurance to be billed for the services provided during this visit.  The patient expressed understanding and agreed to proceed.  The following staff members participated in the virtual visit:  Louisa Second, RN  Post Partum Visit Note Subjective:   [name] is a 30 y.o. [phone] female being evaluated for postpartum followup.  She is 5 weeks 1 day postpartum following a normal spontaneous vaginal delivery at  39w 5d.  I have fully reviewed the prenatal and intrapartum course; pregnancy complicated by gestational thrombocytopenia.  Postpartum course has been unremarkable. Baby is doing well. Baby is feeding by [name]. Bleeding no bleeding. Bowel function is normal. Bladder function is normal. Patient is not sexually active. Contraception method is Nexplanon. Postpartum depression screening: negative.  .   The following portions of the patient's history were reviewed and updated as appropriate: allergies, current medications, past family history, past medical history, past social history, past surgical history and problem list.  Review of Systems Pertinent items noted in HPI and remainder of comprehensive ROS otherwise  negative.   Objective:  There were no vitals filed for this visit. Self-Obtained       Assessment:    Normal postpartum exam.  Plan:  Essential components of care per ACOG recommendations:  1.  Mood and well being: Patient with negative depression screening today. Reviewed local resources for support.  - Patient does not use tobacco.  - hx of drug use? No    2. Infant care and feeding:  -Patient currently breastmilk feeding? Yes  -Social determinants of health (SDOH) reviewed in EPIC. No concerns  3. Sexuality, contraception and birth spacing - Patient does not want a pregnancy in the next year.  Desired family size is 4 children.  - Reviewed forms of contraception in tiered fashion. Patient has Nexplanon in place.   - Discussed birth spacing of 18 months  4. Sleep and fatigue -Encouraged family/partner/community support of 4 hrs of uninterrupted sleep to help with mood and fatigue  5. Physical Recovery  - Discussed patients delivery and complications - Patient had bilateral periurethral degree laceration, perineal healing reviewed. Patient expressed understanding - Patient has urinary incontinence? No  - Patient is safe to resume physical and sexual activity  6.  Health Maintenance - Last pap smear done 08/23/19 and was abnormal with positive high risk HPV. Pt is aware pap smear due July 2022.   10 minutes of non-face-to-face time spent with the patient    Clarnce Flock, [place] for [facility], [place]

## 2021-01-06 ENCOUNTER — Other Ambulatory Visit: Payer: Self-pay | Admitting: Nurse Practitioner

## 2021-01-06 DIAGNOSIS — D696 Thrombocytopenia, unspecified: Secondary | ICD-10-CM

## 2022-04-16 ENCOUNTER — Ambulatory Visit: Payer: BC Managed Care – PPO | Admitting: Family Medicine

## 2022-04-16 ENCOUNTER — Encounter: Payer: Self-pay | Admitting: Family Medicine

## 2022-04-16 ENCOUNTER — Ambulatory Visit (HOSPITAL_COMMUNITY)
Admission: RE | Admit: 2022-04-16 | Discharge: 2022-04-16 | Disposition: A | Discharge status: Home / Self Care | Payer: BC Managed Care – PPO | Source: Ambulatory Visit | Attending: Family Medicine | Admitting: Family Medicine

## 2022-04-16 VITALS — BP 115/78 | HR 94 | Ht 69.0 in | Wt 213.0 lb

## 2022-04-16 DIAGNOSIS — M7121 Synovial cyst of popliteal space [Baker], right knee: Secondary | ICD-10-CM | POA: Insufficient documentation

## 2022-04-16 DIAGNOSIS — R7301 Impaired fasting glucose: Secondary | ICD-10-CM

## 2022-04-16 DIAGNOSIS — D649 Anemia, unspecified: Secondary | ICD-10-CM | POA: Insufficient documentation

## 2022-04-16 DIAGNOSIS — E559 Vitamin D deficiency, unspecified: Secondary | ICD-10-CM

## 2022-04-16 DIAGNOSIS — R87619 Unspecified abnormal cytological findings in specimens from cervix uteri: Secondary | ICD-10-CM | POA: Insufficient documentation

## 2022-04-16 DIAGNOSIS — E038 Other specified hypothyroidism: Secondary | ICD-10-CM

## 2022-04-16 DIAGNOSIS — E7849 Other hyperlipidemia: Secondary | ICD-10-CM

## 2022-04-16 DIAGNOSIS — O210 Mild hyperemesis gravidarum: Secondary | ICD-10-CM | POA: Insufficient documentation

## 2022-04-16 DIAGNOSIS — N761 Subacute and chronic vaginitis: Secondary | ICD-10-CM

## 2022-04-16 DIAGNOSIS — A549 Gonococcal infection, unspecified: Secondary | ICD-10-CM | POA: Insufficient documentation

## 2022-04-16 DIAGNOSIS — N912 Amenorrhea, unspecified: Secondary | ICD-10-CM | POA: Insufficient documentation

## 2022-04-16 NOTE — Patient Instructions (Addendum)
I appreciate the opportunity to provide care to you today!    Follow up:  3 months  Labs: please stop by the lab today/ during the week to get your blood drawn (CBC, CMP, TSH, Lipid profile, HgA1c, Vit D)   Asymptomatic cysts -- Asymptomatic cysts do not require treatment and no precautions are required to prevent progression. We advise patients with asymptomatic cysts that there is a small risk of future cyst rupture. Patients should return if a cyst becomes symptomatic( discomfort with prolonged standing and with hyperflexion of the knee. Symptoms and swelling may be worsened by activity) and should promptly seek further medical attention if they develop signs or symptoms of the pseudothrombophlebitis syndrome( erythema, distal edema, and a positive Homans' sign(calf pain at dorsiflexion of the foot)  Apply  ice on the affected area. To do this: If you have a removable splint, remove it as told by your health care provider. Put ice in a plastic bag. Place a towel between your skin and the bag or between your splint and the bag. Leave the ice on for 20 minutes, 2-3 times a day.       Please continue to a heart-healthy diet and increase your physical activities. Try to exercise for 54mns at least five times a week.      It was a pleasure to see you and I look forward to continuing to work together on your health and well-being. Please do not hesitate to call the office if you need care or have questions about your care.   Have a wonderful day and week. With Gratitude, GAlvira MondayMSN, FNP-BC

## 2022-04-16 NOTE — Assessment & Plan Note (Signed)
Complains of occasional vulvar pruritus No discharge reported She notes some mild odor Symptom has been ongoing intermittently for a few months She reports a tingling sensation with vulvar irritation She has applied over-the-counter Monistat with some relief of her symptoms The patient is not sexually active Her last sexual intercourse was a month ago Pending NuSwab

## 2022-04-16 NOTE — Assessment & Plan Note (Signed)
Onset of symptoms 5 days ago She reports increased physical activity Her gait is grossly intact No evidence of inflammation seen Denies knee stiffness and discomfort with prolonged standing No history of osteoarthritis and rheumatoid arthritis Will get imaging studies today  Encourage activity modification with rest Encouraged ice application to the affected site Referral placed to orthopedic surgery for further evaluation

## 2022-04-16 NOTE — Progress Notes (Signed)
New Patient Office Visit  Subjective:  Patient ID: [name], female    DOB: [birth year]  Age: 32 y.o. MRN: [id number]  CC:  Chief Complaint  Patient presents with   Establish Care   Joint Swelling    Patient complains of a lump behind her R knee starting 5 days ago. Did not injure it that she knows of, for about 2 months the same kneecap has been getting cold.     HPI [name] is a 32 y.o. female with past medical history of obesity, anemia, and amenorrhea presents for establishing care. For the details of today's visit, please refer to the assessment and plan.      Past Medical History:  Diagnosis Date   No pertinent past medical history    Ovarian cyst 2014   Supervision of low-risk pregnancy 08/09/2019    Nursing Staff Provider Office Location [facility] Dating   Early [place] Language  English Anatomy US  Normal with Limited Views Flu Vaccine   Genetic Screen  NIPS: low risk   AFP:    neg  TDaP vaccine   12/22/2019 Hgb A1C or  GTT Early  Third trimester  Rhogam  NA   LAB RESULTS    Blood Type O/Positive/-- (07/14 0934)  Feeding Plan Both Antibody Negative (07/14 0934) Contraception Nexplanon IP Rubella 1    Past Surgical History:  Procedure Laterality Date   CESAREAN SECTION  03/14/2012   Procedure: CESAREAN SECTION;  Surgeon: Melina Schools, MD;  Location: [facility];  Service: Obstetrics;  Laterality: N/A;  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, Primary cesarean section of baby  boy  APGAR 9/9   CHOLECYSTECTOMY     CHOLECYSTECTOMY     NO PAST SURGERIES     tumor on ovary      Family History  Problem Relation Age of Onset   Diabetes Mother    Hypertension Father    Diabetes Maternal Grandmother    Diabetes Paternal Grandmother    Down syndrome Other        maternal half-nephew   Other Neg Hx     Social History   Socioeconomic History   Marital status: Single    Spouse name: Not on file   Number of children: Not on file   Years of education:  Not on file   Highest education level: Not on file  Occupational History   Not on file  Tobacco Use   Smoking status: Former    Packs/day: 0.25    Types: Cigarettes   Smokeless tobacco: Former    Quit date: 06/29/2019  Substance and Sexual Activity   Alcohol use: Yes    Comment: occ   Drug use: Yes    Types: Marijuana    Comment: occ   Sexual activity: Yes  Other Topics Concern   Not on file  Social History Narrative   Not on file   Social Determinants of Health   Financial Resource Strain: Not on file  Food Insecurity: Food Insecurity Present (08/23/2019)   Hunger Vital Sign    Worried About Running Out of Food in the Last Year: Sometimes true    Ran Out of Food in the Last Year: Never true  Transportation Needs: No Transportation Needs (08/23/2019)   PRAPARE - Hydrologist (Medical): No    Lack of Transportation (Non-Medical): No  Physical Activity: Not on file  Stress: Not on file  Social Connections: Not on file  Intimate Partner Violence: Not on file    ROS Review of Systems  Constitutional:  Negative for chills and fever.  Eyes:  Negative for visual disturbance.  Respiratory:  Negative for chest tightness and shortness of breath.   Genitourinary:  Negative for vaginal discharge and vaginal pain.       Vulva pruritus  Musculoskeletal:        Lump behind the right knee  Neurological:  Negative for dizziness and headaches.    Objective:   Today's Vitals: BP 115/78   Pulse 94   Ht '5\' 9"'$  (1.753 m)   Wt 213 lb (96.6 kg)   LMP  (LMP Unknown) Comment: No chance of being pregnant per patient  SpO2 97%   BMI 31.45 kg/m   Physical Exam HENT:     Head: Normocephalic.     Mouth/Throat:     Mouth: Mucous membranes are moist.  Cardiovascular:     Rate and Rhythm: Normal rate.     Heart sounds: Normal heart sounds.  Pulmonary:     Effort: Pulmonary effort is normal.     Breath sounds: Normal breath sounds.  Musculoskeletal:      Comments: Palpable popliteal mass of the right knee that is prominent with standing and the knee fully extended.   Neurological:     Mental Status: She is alert.      Assessment & Plan:   Popliteal cyst, right -     Ambulatory referral to Orthopedic Surgery  Popliteal cyst, unruptured, right Assessment & Plan: Onset of symptoms 5 days ago She reports increased physical activity Her gait is grossly intact No evidence of inflammation seen Denies knee stiffness and discomfort with prolonged standing No history of osteoarthritis and rheumatoid arthritis Will get imaging studies today  Encourage activity modification with rest Encouraged ice application to the affected site Referral placed to orthopedic surgery for further evaluation     Subacute vaginitis Assessment & Plan: Complains of occasional vulvar pruritus No discharge reported She notes some mild odor Symptom has been ongoing intermittently for a few months She reports a tingling sensation with vulvar irritation She has applied over-the-counter Monistat with some relief of her symptoms The patient is not sexually active Her last sexual intercourse was a month ago Pending NuSwab  Orders: -     NuSwab Vaginitis Plus (VG+)  IFG (impaired fasting glucose) -     Hemoglobin A1c  Vitamin D deficiency -     VITAMIN D 25 Hydroxy (Vit-D Deficiency, Fractures)  Other specified hypothyroidism -     TSH + free T4  Other hyperlipidemia -     CBC with Differential/Platelet -     CMP14+EGFR -     Lipid panel  Synovial cyst of right knee -     Rheumatoid factor -     DG Knee 1-2 Views Right     Follow-up: Return in about 3 months (around 07/17/2022).   Alvira Monday, FNP

## 2022-04-17 ENCOUNTER — Other Ambulatory Visit: Payer: Self-pay | Admitting: Family Medicine

## 2022-04-17 DIAGNOSIS — R768 Other specified abnormal immunological findings in serum: Secondary | ICD-10-CM

## 2022-04-17 DIAGNOSIS — E559 Vitamin D deficiency, unspecified: Secondary | ICD-10-CM

## 2022-04-17 DIAGNOSIS — M7121 Synovial cyst of popliteal space [Baker], right knee: Secondary | ICD-10-CM

## 2022-04-17 LAB — CMP14+EGFR
ALT: 27 IU/L (ref 0–32)
AST: 19 IU/L (ref 0–40)
Albumin/Globulin Ratio: 1.7 (ref 1.2–2.2)
Albumin: 4.5 g/dL (ref 3.9–4.9)
Alkaline Phosphatase: 100 IU/L (ref 44–121)
BUN/Creatinine Ratio: 12 (ref 9–23)
BUN: 10 mg/dL (ref 6–20)
Bilirubin Total: 1.3 mg/dL — ABNORMAL HIGH (ref 0.0–1.2)
CO2: 21 mmol/L (ref 20–29)
Calcium: 9.6 mg/dL (ref 8.7–10.2)
Chloride: 105 mmol/L (ref 96–106)
Creatinine, Ser: 0.82 mg/dL (ref 0.57–1.00)
Globulin, Total: 2.7 g/dL (ref 1.5–4.5)
Glucose: 87 mg/dL (ref 70–99)
Potassium: 4 mmol/L (ref 3.5–5.2)
Sodium: 140 mmol/L (ref 134–144)
Total Protein: 7.2 g/dL (ref 6.0–8.5)
eGFR: 97 mL/min/{1.73_m2} (ref >59)

## 2022-04-17 LAB — CBC WITH DIFFERENTIAL/PLATELET
Basophils Absolute: 0 10*3/uL (ref 0.0–0.2)
Basos: 0 %
EOS (ABSOLUTE): 0.1 10*3/uL (ref 0.0–0.4)
Eos: 2 %
Hematocrit: 40.6 % (ref 34.0–46.6)
Hemoglobin: 13.5 g/dL (ref 11.1–15.9)
Immature Grans (Abs): 0 10*3/uL (ref 0.0–0.1)
Immature Granulocytes: 0 %
Lymphocytes Absolute: 2 10*3/uL (ref 0.7–3.1)
Lymphs: 29 %
MCH: 27.9 pg (ref 26.6–33.0)
MCHC: 33.3 g/dL (ref 31.5–35.7)
MCV: 84 fL (ref 79–97)
Monocytes Absolute: 0.3 10*3/uL (ref 0.1–0.9)
Monocytes: 5 %
Neutrophils Absolute: 4.4 10*3/uL (ref 1.4–7.0)
Neutrophils: 64 %
Platelets: 172 10*3/uL (ref 150–450)
RBC: 4.84 x10E6/uL (ref 3.77–5.28)
RDW: 13.7 % (ref 11.7–15.4)
WBC: 6.8 10*3/uL (ref 3.4–10.8)

## 2022-04-17 LAB — LIPID PANEL
Chol/HDL Ratio: 3.9 ratio (ref 0.0–4.4)
Cholesterol, Total: 128 mg/dL (ref 100–199)
HDL: 33 mg/dL — ABNORMAL LOW (ref >39)
LDL Chol Calc (NIH): 75 mg/dL (ref 0–99)
Triglycerides: 105 mg/dL (ref 0–149)
VLDL Cholesterol Cal: 20 mg/dL (ref 5–40)

## 2022-04-17 LAB — TSH+FREE T4
Free T4: 1.28 ng/dL (ref 0.82–1.77)
TSH: 0.926 u[IU]/mL (ref 0.450–4.500)

## 2022-04-17 LAB — VITAMIN D 25 HYDROXY (VIT D DEFICIENCY, FRACTURES): Vit D, 25-Hydroxy: 9.3 ng/mL — ABNORMAL LOW (ref 30.0–100.0)

## 2022-04-17 LAB — HEMOGLOBIN A1C
Est. average glucose Bld gHb Est-mCnc: 100 mg/dL
Hgb A1c MFr Bld: 5.1 % (ref 4.8–5.6)

## 2022-04-17 LAB — RHEUMATOID FACTOR: Rheumatoid fact SerPl-aCnc: 22.1 IU/mL — ABNORMAL HIGH (ref <14.0)

## 2022-04-17 MED ORDER — VITAMIN D (ERGOCALCIFEROL) 1.25 MG (50000 UNIT) PO CAPS: 50000.0000 [IU] | ORAL_CAPSULE | ORAL | 1 refills | Status: DC

## 2022-04-17 NOTE — Progress Notes (Signed)
A weekly vitamin D supplement prescription has been sent to your pharmacy because your vitamin D is low. Her rheumatoid factor was elevated, and a referral has been placed to rheumatology for further evaluation.

## 2022-04-17 NOTE — Progress Notes (Signed)
Please inform the patient that her x-ray was negative for fracture, dislocation, joint effusion, and Soft tissue. Please inform the patient that I have placed an order for an ultrasound to asses her posterior knee cyst more.

## 2022-04-20 LAB — NUSWAB VAGINITIS PLUS (VG+)
Candida albicans, NAA: NEGATIVE
Candida glabrata, NAA: NEGATIVE
Chlamydia trachomatis, NAA: NEGATIVE
Neisseria gonorrhoeae, NAA: NEGATIVE
Trich vag by NAA: NEGATIVE

## 2022-04-20 NOTE — Progress Notes (Signed)
Please inform the patient that she tested negative for bacterial vaginosis, yeast, trichomonas, chlamydia and gonorrhea.  Please encouraged the patient to follow up with her gynecologist as indicated

## 2022-04-21 ENCOUNTER — Ambulatory Visit: Payer: BC Managed Care – PPO | Admitting: Orthopedic Surgery

## 2022-04-22 ENCOUNTER — Telehealth: Payer: Self-pay | Admitting: Family Medicine

## 2022-04-22 ENCOUNTER — Ambulatory Visit (HOSPITAL_COMMUNITY)
Admission: RE | Admit: 2022-04-22 | Discharge: 2022-04-22 | Disposition: A | Discharge status: Home / Self Care | Payer: BC Managed Care – PPO | Source: Ambulatory Visit | Attending: Family Medicine | Admitting: Family Medicine

## 2022-04-22 DIAGNOSIS — M7121 Synovial cyst of popliteal space [Baker], right knee: Secondary | ICD-10-CM | POA: Diagnosis not present

## 2022-04-22 NOTE — Telephone Encounter (Signed)
Please inform the patient that the ultrasound is to verify if she has Baker's cyst in the back of her knee.

## 2022-04-22 NOTE — Telephone Encounter (Signed)
Pt called in regard to recent ultrasound.  Thought that it would be for blood clots, but was for soft tissue only. Wants a call back in regard.

## 2022-04-23 NOTE — Telephone Encounter (Signed)
Pt informed let her know we will call her as soon as results are reviewed.

## 2022-04-23 NOTE — Telephone Encounter (Signed)
Not yet

## 2022-04-23 NOTE — Telephone Encounter (Signed)
Asking if results are back?

## 2022-04-24 ENCOUNTER — Ambulatory Visit (HOSPITAL_COMMUNITY): Payer: BC Managed Care – PPO

## 2022-04-26 NOTE — Progress Notes (Signed)
Please inform the patient that the ultrasound confirms the diagnosis of a Baker's cyst behind her right knee. I encouraged her to follow up with orthopedics

## 2022-04-27 ENCOUNTER — Telehealth: Payer: Self-pay

## 2022-04-27 NOTE — Telephone Encounter (Signed)
Kindly ask the pt to schedule an office visit

## 2022-04-28 NOTE — Telephone Encounter (Signed)
scheduled

## 2022-04-29 ENCOUNTER — Encounter: Payer: Self-pay | Admitting: Internal Medicine

## 2022-04-29 ENCOUNTER — Ambulatory Visit: Payer: BC Managed Care – PPO | Admitting: Family Medicine

## 2022-04-29 ENCOUNTER — Ambulatory Visit (INDEPENDENT_AMBULATORY_CARE_PROVIDER_SITE_OTHER): Payer: BC Managed Care – PPO | Admitting: Internal Medicine

## 2022-04-29 VITALS — BP 132/85 | HR 73 | Ht 69.0 in | Wt 217.2 lb

## 2022-04-29 DIAGNOSIS — M7121 Synovial cyst of popliteal space [Baker], right knee: Secondary | ICD-10-CM

## 2022-04-29 DIAGNOSIS — M722 Plantar fascial fibromatosis: Secondary | ICD-10-CM | POA: Insufficient documentation

## 2022-04-29 MED ORDER — PREDNISONE 20 MG PO TABS
40.0000 mg | ORAL_TABLET | Freq: Every day | ORAL | 0 refills | Status: AC
Start: 2022-04-29 — End: ?

## 2022-04-29 NOTE — Patient Instructions (Addendum)
Please take Prednisone as prescribed.  Okay to take Tylenol as needed for pain.  For foot pain, please apply ice and perform leg elevation as tolerated. Please use ankle brace or splint as discussed today.

## 2022-04-29 NOTE — Assessment & Plan Note (Signed)
Rest and leg elevation Advised to apply ice Advised to apply ankle splint for plantar fasciitis Prednisone 40 mg QD X 5 days

## 2022-04-29 NOTE — Assessment & Plan Note (Addendum)
Recently worse pain likely due to increased physical activity Unlikely to be DVT Rest and leg elevation Apply ice Has been referred to orthopedic surgeon Prednisone 40 mg daily X 5 days as she also has plantar fasciitis and low back pain

## 2022-04-29 NOTE — Progress Notes (Signed)
Acute Office Visit  Subjective:    Patient ID: Crystiana Bartolucci, female    DOB: 12/17/1990, 32 y.o.   MRN: [id number]  Chief Complaint  Patient presents with   bloodclots    Patient had a cyst form on the back of right knee at first and now it is appeared on the back of her left knee. She is having pain in her heal when she walks. She would like to be checked for blood clots    HPI Patient is in today for complaint of bilateral knee pain, for the last 1 month.  She recently had [place] of soft tissue of RLE, which showed popliteal cyst.  She has been having pain around the knee, which radiates towards the legs.  She denies any episode of prolonged immobility.  Denies any numbness or tingling of the LE.  Of note, she reports that she has been lifting/moving heavy objects recently as she recently moved.  Of note, her rheumatoid factor was also found to be slightly elevated recently, and is planned to see Rheumatologist.  She also reports pain in the right heel area for the last 2 weeks, worse upon walking and putting pressure.  Denies any recent injury.  Denies noticing any local swelling or redness.  Past Medical History:  Diagnosis Date   No pertinent past medical history    Ovarian cyst 2014   Supervision of low-risk pregnancy 08/09/2019    Nursing Staff Provider Office Location [facility] Dating   Early [place] Language  English Anatomy US  Normal with Limited Views Flu Vaccine   Genetic Screen  NIPS: low risk   AFP:    neg  TDaP vaccine   12/22/2019 Hgb A1C or  GTT Early  Third trimester  Rhogam  NA   LAB RESULTS    Blood Type O/Positive/-- (07/14 0934)  Feeding Plan Both Antibody Negative (07/14 0934) Contraception Nexplanon IP Rubella 1    Past Surgical History:  Procedure Laterality Date   CESAREAN SECTION  03/14/2012   Procedure: CESAREAN SECTION;  Surgeon: Melina Schools, MD;  Location: [facility];  Service: Obstetrics;  Laterality: N/A;  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,  Primary cesarean section of baby  boy  APGAR 9/9   CHOLECYSTECTOMY     CHOLECYSTECTOMY     NO PAST SURGERIES     tumor on ovary      Family History  Problem Relation Age of Onset   Diabetes Mother    Hypertension Father    Diabetes Maternal Grandmother    Diabetes Paternal Grandmother    Down syndrome Other        maternal half-nephew   Other Neg Hx     Social History   Socioeconomic History   Marital status: Single    Spouse name: Not on file   Number of children: Not on file   Years of education: Not on file   Highest education level: Not on file  Occupational History   Not on file  Tobacco Use   Smoking status: Former    Packs/day: .25    Types: Cigarettes   Smokeless tobacco: Former    Quit date: 06/29/2019  Substance and Sexual Activity   Alcohol use: Yes    Comment: occ   Drug use: Yes    Types: Marijuana    Comment: occ   Sexual activity: Yes  Other Topics Concern   Not on file  Social History Narrative   Not on file  Social Determinants of Health   Financial Resource Strain: Not on file  Food Insecurity: Food Insecurity Present (08/23/2019)   Hunger Vital Sign    Worried About Running Out of Food in the Last Year: Sometimes true    Ran Out of Food in the Last Year: Never true  Transportation Needs: No Transportation Needs (08/23/2019)   PRAPARE - Hydrologist (Medical): No    Lack of Transportation (Non-Medical): No  Physical Activity: Not on file  Stress: Not on file  Social Connections: Not on file  Intimate Partner Violence: Not on file    Outpatient Medications Prior to Visit  Medication Sig Dispense Refill   Vitamin D, Ergocalciferol, (DRISDOL) 1.25 MG (50000 UNIT) CAPS capsule Take 1 capsule (50,000 Units total) by mouth every 7 (seven) days. 10 capsule 1   No facility-administered medications prior to visit.    No Known Allergies  Review of Systems  Constitutional:  Negative for chills and fever.  HENT:   Negative for congestion, sinus pressure, sinus pain and sore throat.   Eyes:  Negative for pain and discharge.  Respiratory:  Negative for cough and shortness of breath.   Cardiovascular:  Negative for chest pain and palpitations.  Gastrointestinal:  Negative for abdominal pain, constipation, diarrhea, nausea and vomiting.  Endocrine: Negative for polydipsia and polyuria.  Genitourinary:  Negative for dysuria and hematuria.  Musculoskeletal:  Positive for arthralgias and back pain. Negative for neck pain and neck stiffness.       Right heel pain  Skin:  Negative for rash.  Neurological:  Negative for dizziness and weakness.  Psychiatric/Behavioral:  Negative for agitation and behavioral problems.        Objective:    Physical Exam Vitals reviewed.  Constitutional:      General: She is not in acute distress.    Appearance: She is not diaphoretic.  Eyes:     General: No scleral icterus.    Extraocular Movements: Extraocular movements intact.  Cardiovascular:     Rate and Rhythm: Normal rate and regular rhythm.     Heart sounds: Normal heart sounds. No murmur heard.    Comments: DPA pulse intact bilaterally Pulmonary:     Breath sounds: Normal breath sounds. No wheezing or rales.  Musculoskeletal:     Cervical back: Neck supple. No tenderness.     Right lower leg: No edema.     Left lower leg: No edema.     Comments: Cyst like structure in the right popliteal area  Skin:    General: Skin is warm.     Findings: No rash.  Neurological:     General: No focal deficit present.     Mental Status: She is alert and oriented to person, place, and time.  Psychiatric:        Mood and Affect: Mood normal.        Behavior: Behavior normal.     BP 132/85 (BP Location: Right Arm, Patient Position: Sitting, Cuff Size: Large)   Pulse 73   Ht 5\' 9"  (1.753 m)   Wt 217 lb 3.2 oz (98.5 kg)   LMP  (LMP Unknown) Comment: No chance of being pregnant per patient  SpO2 98%   BMI 32.07 kg/m   Wt Readings from Last 3 Encounters:  04/29/22 217 lb 3.2 oz (98.5 kg)  04/16/22 213 lb (96.6 kg)  03/10/20 238 lb 9 oz (108.2 kg)        Assessment & Plan:  Problem List Items Addressed This Visit       Musculoskeletal and Integument   Popliteal cyst, unruptured, right - Primary    Recently worse pain likely due to increased physical activity Unlikely to be DVT Rest and leg elevation Apply ice Has been referred to orthopedic surgeon Prednisone 40 mg daily X 5 days as she also has plantar fasciitis and low back pain      Plantar fasciitis of right foot    Rest and leg elevation Advised to apply ice Advised to apply ankle splint for plantar fasciitis Prednisone 40 mg QD X 5 days      Relevant Medications   predniSONE (DELTASONE) 20 MG tablet     Meds ordered this encounter  Medications   predniSONE (DELTASONE) 20 MG tablet    Sig: Take 2 tablets (40 mg total) by mouth daily with breakfast.    Dispense:  10 tablet    Refill:  0     Alyan Hartline Keith Rake, MD

## 2022-05-12 ENCOUNTER — Ambulatory Visit: Payer: BC Managed Care – PPO | Admitting: Orthopedic Surgery

## 2022-05-18 ENCOUNTER — Other Ambulatory Visit: Payer: Self-pay

## 2022-05-18 ENCOUNTER — Emergency Department (HOSPITAL_COMMUNITY)
Admission: EM | Admit: 2022-05-18 | Discharge: 2022-05-18 | Disposition: A | Discharge status: Home / Self Care | Payer: BC Managed Care – PPO | Attending: Emergency Medicine | Admitting: Emergency Medicine

## 2022-05-18 ENCOUNTER — Encounter (HOSPITAL_COMMUNITY): Payer: Self-pay

## 2022-05-18 DIAGNOSIS — W260XXA Contact with knife, initial encounter: Secondary | ICD-10-CM | POA: Diagnosis not present

## 2022-05-18 DIAGNOSIS — S61011A Laceration without foreign body of right thumb without damage to nail, initial encounter: Secondary | ICD-10-CM | POA: Insufficient documentation

## 2022-05-18 DIAGNOSIS — Z23 Encounter for immunization: Secondary | ICD-10-CM | POA: Insufficient documentation

## 2022-05-18 MED ORDER — TETANUS-DIPHTH-ACELL PERTUSSIS 5-2.5-18.5 LF-MCG/0.5 IM SUSY
0.5000 mL | PREFILLED_SYRINGE | Freq: Once | INTRAMUSCULAR | Status: AC
  Administered 2022-05-18: 0.5 mL via INTRAMUSCULAR
  Filled 2022-05-18: qty 0.5

## 2022-05-18 MED ORDER — LIDOCAINE HCL (PF) 2 % IJ SOLN
10.0000 mL | Freq: Once | INTRAMUSCULAR | Status: AC
  Administered 2022-05-18: 10 mL

## 2022-05-18 MED ORDER — POVIDONE-IODINE 10 % EX SOLN
CUTANEOUS | Status: AC
  Filled 2022-05-18: qty 29.6

## 2022-05-18 NOTE — ED Provider Notes (Signed)
[place] EMERGENCY DEPARTMENT AT [facility]  Provider Note  CSN: [id number] Arrival date & time: 05/18/22 0019  History Chief Complaint  Patient presents with   Laceration    [name] is a 32 y.o. female reports she tried to catch a falling knife earlier tonight sustaining a laceration to her R thumb. Unsure last TDAP.    Home Medications Prior to Admission medications   Medication Sig Start Date End Date Taking? Authorizing Provider  predniSONE (DELTASONE) 20 MG tablet Take 2 tablets (40 mg total) by mouth daily with breakfast. 04/29/22   Anabel Halon, MD  Vitamin D, Ergocalciferol, (DRISDOL) 1.25 MG (50000 UNIT) CAPS capsule Take 1 capsule (50,000 Units total) by mouth every 7 (seven) days. 04/17/22   Gilmore Laroche, FNP     Allergies    Patient has no known allergies.   Review of Systems   Review of Systems Please see HPI for pertinent positives and negatives  Physical Exam BP 107/70   Pulse 66   Temp 98.3 F (36.8 C)   Resp 16   Wt 104.3 kg   SpO2 98%   BMI 33.97 kg/m   Physical Exam Vitals and nursing note reviewed.  HENT:     Head: Normocephalic.     Nose: Nose normal.  Eyes:     Extraocular Movements: Extraocular movements intact.  Pulmonary:     Effort: Pulmonary effort is normal.  Musculoskeletal:        General: Normal range of motion.     Cervical back: Neck supple.     Comments: 5cm T-shaped laceration over MCP joint of R thumb, bleeding controlled.   Skin:    Findings: No rash (on exposed skin).  Neurological:     Mental Status: She is alert and oriented to person, place, and time.  Psychiatric:        Mood and Affect: Mood normal.     ED Results / Procedures / Treatments   EKG None  Procedures .Marland KitchenLaceration Repair  Date/Time: 05/18/2022 2:38 AM  Performed by: Pollyann Savoy, MD Authorized by: Pollyann Savoy, MD   Consent:    Consent obtained:  Verbal   Consent given by:  Patient Laceration details:     Location:  Finger   Finger location:  R thumb   Length (cm):  5 Exploration:    Wound extent: no nerve damage and no tendon damage   Treatment:    Area cleansed with:  Povidone-iodine and saline   Amount of cleaning:  Standard   Irrigation solution:  Sterile water   Irrigation method:  Syringe Skin repair:    Repair method:  Sutures   Suture size:  4-0   Suture material:  Nylon   Number of sutures:  7 Repair type:    Repair type:  Simple Post-procedure details:    Dressing:  Non-adherent dressing   Procedure completion:  Tolerated well, no immediate complications   Medications Ordered in the ED Medications  Tdap (BOOSTRIX) injection 0.5 mL (has no administration in time range)  lidocaine HCl (PF) (XYLOCAINE) 2 % injection 10 mL (10 mLs Infiltration Given 05/18/22 0222)  povidone-iodine (BETADINE) 10 % external solution ( Topical Given 05/18/22 0159)    Initial Impression and Plan  Patient with R thumb laceration, repaired as above. TDAP updated. Standard wound care instructions given. Suture removal in 7-10 days. RTED for any other concerns.   ED Course       MDM Rules/Calculators/A&P Medical Decision  Making Problems Addressed: Laceration of right thumb without foreign body without damage to nail, initial encounter: acute illness or injury  Risk Prescription drug management.     Final Clinical Impression(s) / ED Diagnoses Final diagnoses:  Laceration of right thumb without foreign body without damage to nail, initial encounter    Rx / DC Orders ED Discharge Orders     None        Pollyann Savoy, MD 05/18/22 [phone]

## 2022-05-18 NOTE — ED Triage Notes (Signed)
Pt presents with finger laceration to right thumb after picking up a knife tonight that she dropped. Bleeding controlled.

## 2022-05-20 ENCOUNTER — Ambulatory Visit: Payer: Medicaid Other | Admitting: Family Medicine

## 2022-06-22 ENCOUNTER — Ambulatory Visit: Payer: BC Managed Care – PPO | Admitting: Orthopedic Surgery

## 2022-06-22 ENCOUNTER — Encounter: Payer: Self-pay | Admitting: Orthopedic Surgery

## 2022-06-22 DIAGNOSIS — M722 Plantar fascial fibromatosis: Secondary | ICD-10-CM | POA: Diagnosis not present

## 2022-06-22 DIAGNOSIS — M659 Synovitis and tenosynovitis, unspecified: Secondary | ICD-10-CM

## 2022-06-22 MED ORDER — MELOXICAM 15 MG PO TABS: ORAL_TABLET | ORAL | 0 refills | Status: DC

## 2022-06-22 NOTE — Progress Notes (Signed)
Office Visit Note   Patient: [name]           Date of Birth: 04-Dec-1990           MRN: [id number] Visit Date: 06/22/2022 Requested by: Gilmore Laroche, FNP [address] [place],  [place] [address] PCP: Gilmore Laroche, FNP  Subjective: Chief Complaint  Patient presents with   Left Knee - Pain   Right Knee - Pain   Right Foot - Pain    HPI: [name] is a 32 y.o. female who presents to the office reporting right greater than left knee pain of 3 months duration along with right foot pain of 2 months duration.  She has had radiographs and ultrasound done at [facility] which shows a small Baker's cyst in the back of the right knee.  She does feel some crepitus going up and down stairs.  She uses a brace at times.  She feels like her knees may be buckling at times.  Has some sciatica on the left-hand side.  Does not take much in terms of medication for pain.  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  She has 3 children at home.  She did take prednisone for plantar fasciitis for 6-day dose course several months ago and that did help her knee.  She has also recently moved into a new house which is putting increased stress on her knees.  She also reports right foot pain of 2 months duration.  She was told she has plantar fasciitis.  Describes relatively constant pain in the plantar aspect of the calcaneus..                ROS: All systems reviewed are negative as they relate to the chief complaint within the history of present illness.  Patient denies fevers or chills.  Assessment & Plan: Visit Diagnoses:  1. Synovitis of right knee   2. Plantar fasciitis of right foot     Plan: Impression is right knee synovitis with no structural problem noted on radiographs or exam.  Could consider an injection as a neck step.  We will try Mobic 15 mg a day for 2 weeks then as needed after that as a first intervention.  Injection will be next intervention.   Regarding her right foot pain she does have pain in the location classic for plantar fasciitis.  Negative squeeze test on the calcaneus.  Excellent heel cord flexibility.  I think her biggest issue is likely her shoes.  She needs to get some shoes that have better arches because she does have some pes planus and is currently wearing shoes without very good arches.  Follow-Up Instructions: No follow-ups on file.   Orders:  No orders of the defined types were placed in this encounter.  Meds ordered this encounter  Medications   meloxicam (MOBIC) 15 MG tablet    Sig: 1 po q d x 2 weeks    Dispense:  30 tablet    Refill:  0      Procedures: No procedures performed   Clinical Data: No additional findings.  Objective: Vital Signs: There were no vitals taken for this visit.  Physical Exam:  Constitutional: Patient appears well-developed HEENT:  Head: Normocephalic Eyes:EOM are normal Neck: Normal range of motion Cardiovascular: Normal rate Pulmonary/chest: Effort normal Neurologic: Patient is alert Skin: Skin is warm Psychiatric: Patient has normal mood and affect  Ortho Exam: Ortho exam  demonstrates appropriate heel inversion when she stands on her toes.  Pedal pulses palpable.  Negative squeeze test on the right calcaneus.  Does have slight pain around the plantar fascia attachment site on the calcaneus.  Palpable intact nontender anterior to posterior to peroneal and Achilles tendons.  Excellent ankle dorsiflexion to about 25 to 30 degrees.  Right knee demonstrates full active and passive range of motion with no effusion.  No tenderness with patellar movement.  Collateral crucial ligaments are stable.  No joint line tenderness.  Small Baker's cyst is palpable.  Could consider aspiration of that Baker's cyst if she comes back in for injection of the knee.  Specialty Comments:  No specialty comments available.  Imaging: No results found.   PMFS History: Patient Active  Problem List   Diagnosis Date Noted   Plantar fasciitis of right foot 04/29/2022   Abnormal cervical Papanicolaou smear 04/16/2022   Amenorrhea 04/16/2022   Anemia 04/16/2022   Gonorrhea 04/16/2022   Morning sickness 04/16/2022   Popliteal cyst, unruptured, right 04/16/2022   Subacute vaginitis 04/16/2022   Gestational thrombocytopenia (HCC) 12/23/2019   Obesity 09/21/2019   Cervical high risk human papillomavirus (HPV) DNA test positive 09/03/2019   History of VBAC x 3 08/09/2019   Uterine scar from previous surgery affecting pregnancy 09/24/2016   Pregnancy 02/13/2015   Past Medical History:  Diagnosis Date   No pertinent past medical history    Ovarian cyst 2014   Supervision of low-risk pregnancy 08/09/2019    Nursing Staff Provider Office Location [facility] Dating   Early [place] Language  English Anatomy US  Normal with Limited Views Flu Vaccine   Genetic Screen  NIPS: low risk   AFP:    neg  TDaP vaccine   12/22/2019 Hgb A1C or  GTT Early  Third trimester  Rhogam  NA   LAB RESULTS    Blood Type O/Positive/-- (07/14 0934)  Feeding Plan Both Antibody Negative (07/14 0934) Contraception Nexplanon IP Rubella 1    Family History  Problem Relation Age of Onset   Diabetes Mother    Hypertension Father    Diabetes Maternal Grandmother    Diabetes Paternal Grandmother    Down syndrome Other        maternal half-nephew   Other Neg Hx     Past Surgical History:  Procedure Laterality Date   CESAREAN SECTION  03/14/2012   Procedure: CESAREAN SECTION;  Surgeon: Bing Plume, MD;  Location: [facility];  Service: Obstetrics;  Laterality: N/A;  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

## 2022-07-23 ENCOUNTER — Ambulatory Visit: Payer: BC Managed Care – PPO | Admitting: Family Medicine

## 2022-07-26 ENCOUNTER — Other Ambulatory Visit: Payer: Self-pay | Admitting: Orthopedic Surgery

## 2022-07-29 NOTE — Progress Notes (Signed)
Office Visit Note  Patient: [name]             Date of Birth: 1990-11-25           MRN: [id number]             PCP: Gilmore Laroche, FNP Referring: Gilmore Laroche, FNP Visit Date: 07/30/2022  Subjective:  New Patient (Initial Visit) (Patient states she has a baker cyst in her right knee which led to plantar fascitis. Patient states she has inflammation, pain, and weakness in her knees. Patient states she has pain in her knees when she walks up stairs. )   History of Present Illness: [name] is a 32 y.o. female here for evaluation of positive rheumatoid factor associated with knee pain, right popliteal cyst, and plantar fasciitis.  Problems started earlier this year she for saw her primary care doctor in March. At the time was seeing persistent swelling for a couple days though she was noticing a cold sensation in the front of the knee going back to around January.  She did recall an increase in physical activity with family relocating around this time.  She did not recall any particular injury to this area.  In the past she has noticed clicking or popping sensation and sound often associated with climbing stairs more recently is actually getting some pain during activity.  Lab test from that visit showed a mildly elevated rheumatoid factor of 22 and vitamin D deficiency at 9.  X-ray of the knee was checked look pretty unremarkable though on my personal review look suggestive for some superior patellar osteophyte.  Ultrasound of the knee demonstrating a intact popliteal cyst.  She is also getting some pain under the right heel consistent with plantar fasciitis that is a new problem.  She saw Dr. August Saucer for this problem last month and was prescribed meloxicam which gives a substantial benefit to symptoms.  She is also wearing heeled shoes daily which alleviates heel pain.  She was prescribed a course of the high-dose 50,000 units vitamin D supplement.    Activities of Daily Living:   Patient reports morning stiffness for 0 minute.   Patient Denies nocturnal pain.  Difficulty dressing/grooming: Denies Difficulty climbing stairs: Reports Difficulty getting out of chair: Reports Difficulty using hands for taps, buttons, cutlery, and/or writing: Denies  Review of Systems  Constitutional:  Positive for fatigue.  HENT:  Negative for mouth sores and mouth dryness.   Eyes:  Negative for dryness.  Respiratory:  Negative for shortness of breath.   Cardiovascular:  Negative for chest pain and palpitations.  Gastrointestinal:  Negative for blood in stool, constipation and diarrhea.  Endocrine: Negative for increased urination.  Genitourinary:  Negative for involuntary urination.  Musculoskeletal:  Positive for joint pain, joint pain and joint swelling. Negative for gait problem, myalgias, muscle weakness, morning stiffness, muscle tenderness and myalgias.  Skin:  Negative for color change, rash, hair loss and sensitivity to sunlight.  Allergic/Immunologic: Negative for susceptible to infections.  Neurological:  Negative for dizziness and headaches.  Hematological:  Negative for swollen glands.  Psychiatric/Behavioral:  Positive for sleep disturbance. Negative for depressed mood. The patient is nervous/anxious.     PMFS History:  Patient Active Problem List   Diagnosis Date Noted   Vitamin D deficiency 07/30/2022   Rheumatoid factor positive 07/30/2022   Plantar fasciitis of right foot 04/29/2022   Abnormal cervical Papanicolaou smear 04/16/2022   Amenorrhea 04/16/2022   Anemia 04/16/2022   Gonorrhea 04/16/2022  Morning sickness 04/16/2022   Popliteal cyst, unruptured, right 04/16/2022   Subacute vaginitis 04/16/2022   Gestational thrombocytopenia (HCC) 12/23/2019   Obesity 09/21/2019   Cervical high risk human papillomavirus (HPV) DNA test positive 09/03/2019   History of VBAC x 3 08/09/2019   Uterine scar from previous surgery affecting pregnancy 09/24/2016    Pregnancy 02/13/2015    Past Medical History:  Diagnosis Date   Baker's cyst    back of right knee   No pertinent past medical history    Ovarian cyst 2014   Plantar fasciitis    right foot   Supervision of low-risk pregnancy 08/09/2019    Nursing Staff Provider Office Location [facility] Dating   Early [place] Language  English Anatomy US  Normal with Limited Views Flu Vaccine   Genetic Screen  NIPS: low risk   AFP:    neg  TDaP vaccine   12/22/2019 Hgb A1C or  GTT Early  Third trimester  Rhogam  NA   LAB RESULTS    Blood Type O/Positive/-- (07/14 0934)  Feeding Plan Both Antibody Negative (07/14 0934) Contraception Nexplanon IP Rubella 1    Family History  Problem Relation Age of Onset   Diabetes Mother    Hypertension Father    Psoriasis Sister    Diabetes Sister    Diabetes Maternal Grandmother    Diabetes Paternal Grandmother    Down syndrome Other        maternal half-nephew   Other Neg Hx    Past Surgical History:  Procedure Laterality Date   CESAREAN SECTION  03/14/2012   Procedure: CESAREAN SECTION;  Surgeon: Bing Plume, MD;  Location: [facility];  Service: Obstetrics;  Laterality: N/A;  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, Primary cesarean section of baby  boy  APGAR 9/9   CHOLECYSTECTOMY     CHOLECYSTECTOMY     NO PAST SURGERIES     tumor on ovary     Social History   Social History Narrative   Not on file   Immunization History  Administered Date(s) Administered   Influenza Split 03/15/2012   MMR 03/17/2012   Tdap 03/14/2012, 06/14/2015, 09/24/2016, 12/22/2019, 05/18/2022     Objective: Vital Signs: BP 108/69 (BP Location: Right Arm, Patient Position: Sitting, Cuff Size: Normal)   Pulse 66   Resp 14   Ht 5\' 9"  (1.753 m)   Wt 221 lb (100.2 kg)   LMP 06/29/2022   Breastfeeding No   BMI 32.64 kg/m    Physical Exam Constitutional:      Appearance: She is obese.  Cardiovascular:     Rate and Rhythm: Normal rate and regular rhythm.   Pulmonary:     Effort: Pulmonary effort is normal.     Breath sounds: Normal breath sounds.  Musculoskeletal:     Right lower leg: No edema.     Left lower leg: No edema.  Skin:    General: Skin is warm and dry.     Findings: No rash.  Neurological:     Mental Status: She is alert.  Psychiatric:        Mood and Affect: Mood normal.        Behavior: Behavior normal.      Musculoskeletal Exam:  Shoulders full ROM no tenderness or swelling Elbows full ROM no tenderness or swelling Wrists full ROM no tenderness or swelling Fingers full ROM no tenderness or swelling No paraspinal tenderness to palpation over upper and lower back Hip  normal internal and external rotation without pain, no tenderness to lateral hip palpation Knees full ROM right knee patellofemoral crepitus, popliteal cyst present but no palpable effusion Ankles full ROM no tenderness or swelling   Investigation: No additional findings.  Imaging: No results found.  Recent Labs: Lab Results  Component Value Date   WBC 6.8 04/16/2022   HGB 13.5 04/16/2022   PLT 172 04/16/2022   NA 140 04/16/2022   K 4.0 04/16/2022   CL 105 04/16/2022   CO2 21 04/16/2022   GLUCOSE 87 04/16/2022   BUN 10 04/16/2022   CREATININE 0.82 04/16/2022   BILITOT 1.3 (H) 04/16/2022   ALKPHOS 100 04/16/2022   AST 19 04/16/2022   ALT 27 04/16/2022   PROT 7.2 04/16/2022   ALBUMIN 4.5 04/16/2022   CALCIUM 9.6 04/16/2022   GFRAA >60 07/03/2019    Speciality Comments: No specialty comments available.  Procedures:  No procedures performed Allergies: Patient has no known allergies.   Assessment / Plan:     Visit Diagnoses: Rheumatoid factor positive - Plan: Sedimentation rate, Rheumatoid factor, Cyclic citrul peptide antibody, IgG  Positive rheumatoid factor but there is no appreciable synovitis on exam today.  There is intact right popliteal cyst though this may be accounted for by patellofemoral osteoarthritis as there is no  current fluid collection of the suprapatellar pouch.  Will check sedimentation rate assessing for systemic inflammatory disease.  Will repeat the low positive rheumatoid factor for any upward trend also check CCP antibody for possible RA in a current smoker.  If labs are abnormal but with only localized symptoms will need to discuss whether to start disease specific treatment at this time first monitor.  If results are reassuring I would recommend her to follow-up with orthopedic surgery clinic.  Popliteal cyst, unruptured, right  Not overly painful but has persisted for several months now.  By limited ultrasound exam in clinic today widest dimension about 3.5 cm seems equal or slightly decreased compared to March.  She is getting discomfort with using full ankle dorsiflexion and knee extension that is probably contributing to the heel problems.  Plantar fasciitis of right foot  Provided printed plantar fasciitis exercises for the right foot.  Discussed current use of high heeled footwear is okay for initial resting but would be beneficial to work on range of motion for long-term benefit.  Vitamin D deficiency - Plan: VITAMIN D 25 Hydroxy (Vit-D Deficiency, Fractures)  Deficient at 9 completed initial prescription of 50,000 units vitamin D now reports taking over-the-counter daily supplement.  Discussed importance of continued adequate vitamin D either by increased outdoor activities or supplementation for joint health and reducing long-term risk of osteoporosis.  Orders: Orders Placed This Encounter  Procedures   Sedimentation rate   Rheumatoid factor   Cyclic citrul peptide antibody, IgG   VITAMIN D 25 Hydroxy (Vit-D Deficiency, Fractures)   No orders of the defined types were placed in this encounter.    Follow-Up Instructions: Return if symptoms worsen or fail to improve.   Fuller Plan, MD  Note - This record has been created using [employer].  Chart creation errors  have been sought, but may not always  have been located. Such creation errors do not reflect on  the standard of medical care.

## 2022-07-30 ENCOUNTER — Ambulatory Visit: Payer: BC Managed Care – PPO | Attending: Internal Medicine | Admitting: Internal Medicine

## 2022-07-30 ENCOUNTER — Encounter: Payer: Self-pay | Admitting: Internal Medicine

## 2022-07-30 VITALS — BP 108/69 | HR 66 | Resp 14 | Ht 69.0 in | Wt 221.0 lb

## 2022-07-30 DIAGNOSIS — R768 Other specified abnormal immunological findings in serum: Secondary | ICD-10-CM

## 2022-07-30 DIAGNOSIS — M7121 Synovial cyst of popliteal space [Baker], right knee: Secondary | ICD-10-CM | POA: Diagnosis not present

## 2022-07-30 DIAGNOSIS — M722 Plantar fascial fibromatosis: Secondary | ICD-10-CM | POA: Diagnosis not present

## 2022-07-30 DIAGNOSIS — E559 Vitamin D deficiency, unspecified: Secondary | ICD-10-CM

## 2022-07-31 LAB — VITAMIN D 25 HYDROXY (VIT D DEFICIENCY, FRACTURES): Vit D, 25-Hydroxy: 37 ng/mL (ref 30–100)

## 2022-07-31 LAB — RHEUMATOID FACTOR: Rheumatoid fact SerPl-aCnc: 19 IU/mL — ABNORMAL HIGH (ref <14)

## 2022-08-02 LAB — CYCLIC CITRUL PEPTIDE ANTIBODY, IGG: Cyclic Citrullin Peptide Ab: <16 UNITS

## 2022-08-02 LAB — SEDIMENTATION RATE: Sed Rate: 2 mm/h (ref 0–20)

## 2022-08-06 ENCOUNTER — Ambulatory Visit: Payer: BC Managed Care – PPO | Admitting: Family Medicine

## 2023-09-08 ENCOUNTER — Ambulatory Visit (INDEPENDENT_AMBULATORY_CARE_PROVIDER_SITE_OTHER): Payer: Self-pay | Admitting: Family Medicine

## 2023-09-08 ENCOUNTER — Encounter: Payer: Self-pay | Admitting: Family Medicine

## 2023-09-08 VITALS — BP 125/84 | HR 81 | Resp 16 | Ht 69.0 in | Wt 220.0 lb

## 2023-09-08 DIAGNOSIS — M791 Myalgia, unspecified site: Secondary | ICD-10-CM

## 2023-09-08 DIAGNOSIS — R102 Pelvic and perineal pain: Secondary | ICD-10-CM | POA: Diagnosis not present

## 2023-09-08 DIAGNOSIS — Z62819 Personal history of unspecified abuse in childhood: Secondary | ICD-10-CM | POA: Diagnosis not present

## 2023-09-08 DIAGNOSIS — M26651 Arthropathy of right temporomandibular joint: Secondary | ICD-10-CM

## 2023-09-08 DIAGNOSIS — M7121 Synovial cyst of popliteal space [Baker], right knee: Secondary | ICD-10-CM

## 2023-09-08 DIAGNOSIS — F322 Major depressive disorder, single episode, severe without psychotic features: Secondary | ICD-10-CM

## 2023-09-08 DIAGNOSIS — R768 Other specified abnormal immunological findings in serum: Secondary | ICD-10-CM

## 2023-09-08 DIAGNOSIS — F411 Generalized anxiety disorder: Secondary | ICD-10-CM | POA: Diagnosis not present

## 2023-09-08 MED ORDER — BUSPIRONE HCL 5 MG PO TABS: 5.0000 mg | ORAL_TABLET | Freq: Three times a day (TID) | ORAL | 1 refills | Status: DC

## 2023-09-08 MED ORDER — ESCITALOPRAM OXALATE 10 MG PO TABS: 10.0000 mg | ORAL_TABLET | Freq: Every day | ORAL | 3 refills | Status: AC

## 2023-09-08 NOTE — Patient Instructions (Addendum)
 F/U with PCP  ( Gloria)in 4 to 6 weeks  New are medications for depression and anxiety and you are referred to therapy  Labs today, ESR, CBC, TSH, cmp and EGFR, Vit D, CK ( muscle pain)  You will be referred back to Orthopedics and rheumatology, already has appts with Drs  Addie and Jeannetta so will remove referrals   For clicking jaw, please stop chewing gum, and use cold compress on jaw  Nuswab for pelvic pain, and you are also being referred for pelvic us   Thanks for choosing [facility], we consider it a privelige to serve you.

## 2023-09-09 ENCOUNTER — Ambulatory Visit: Payer: Self-pay | Admitting: Family Medicine

## 2023-09-10 LAB — CBC WITH DIFFERENTIAL/PLATELET
Basophils Absolute: 0 x10E3/uL (ref 0.0–0.2)
Basos: 0 %
EOS (ABSOLUTE): 0.2 x10E3/uL (ref 0.0–0.4)
Eos: 2 %
Hematocrit: 42 % (ref 34.0–46.6)
Hemoglobin: 13.6 g/dL (ref 11.1–15.9)
Immature Grans (Abs): 0 x10E3/uL (ref 0.0–0.1)
Immature Granulocytes: 0 %
Lymphocytes Absolute: 2.7 x10E3/uL (ref 0.7–3.1)
Lymphs: 34 %
MCH: 27.8 pg (ref 26.6–33.0)
MCHC: 32.4 g/dL (ref 31.5–35.7)
MCV: 86 fL (ref 79–97)
Monocytes Absolute: 0.4 x10E3/uL (ref 0.1–0.9)
Monocytes: 5 %
Neutrophils Absolute: 4.7 x10E3/uL (ref 1.4–7.0)
Neutrophils: 59 %
Platelets: 180 x10E3/uL (ref 150–450)
RBC: 4.9 x10E6/uL (ref 3.77–5.28)
RDW: 13.5 % (ref 11.7–15.4)
WBC: 7.9 x10E3/uL (ref 3.4–10.8)

## 2023-09-10 LAB — CMP14+EGFR
ALT: 29 IU/L (ref 0–32)
AST: 24 IU/L (ref 0–40)
Albumin: 4.9 g/dL (ref 3.9–4.9)
Alkaline Phosphatase: 100 IU/L (ref 44–121)
BUN/Creatinine Ratio: 12 (ref 9–23)
BUN: 11 mg/dL (ref 6–20)
Bilirubin Total: 0.7 mg/dL (ref 0.0–1.2)
CO2: 19 mmol/L — ABNORMAL LOW (ref 20–29)
Calcium: 9.8 mg/dL (ref 8.7–10.2)
Chloride: 103 mmol/L (ref 96–106)
Creatinine, Ser: 0.9 mg/dL (ref 0.57–1.00)
Globulin, Total: 2.8 g/dL (ref 1.5–4.5)
Glucose: 98 mg/dL (ref 70–99)
Potassium: 4.8 mmol/L (ref 3.5–5.2)
Sodium: 140 mmol/L (ref 134–144)
Total Protein: 7.7 g/dL (ref 6.0–8.5)
eGFR: 86 mL/min/1.73 (ref >59)

## 2023-09-10 LAB — CK: Total CK: 119 U/L (ref 32–182)

## 2023-09-10 LAB — SEDIMENTATION RATE: Sed Rate: 4 mm/h (ref 0–32)

## 2023-09-10 LAB — VITAMIN D 25 HYDROXY (VIT D DEFICIENCY, FRACTURES): Vit D, 25-Hydroxy: 24.4 ng/mL — ABNORMAL LOW (ref 30.0–100.0)

## 2023-09-10 LAB — TSH: TSH: 1.57 u[IU]/mL (ref 0.450–4.500)

## 2023-09-11 LAB — NUSWAB VAGINITIS PLUS (VG+)
Candida albicans, NAA: POSITIVE — AB
Candida glabrata, NAA: NEGATIVE
Chlamydia trachomatis, NAA: NEGATIVE
Neisseria gonorrhoeae, NAA: NEGATIVE
Trich vag by NAA: NEGATIVE

## 2023-09-11 MED ORDER — FLUCONAZOLE 150 MG PO TABS: 150.0000 mg | ORAL_TABLET | Freq: Once | ORAL | 0 refills | Status: AC

## 2023-09-12 DIAGNOSIS — R102 Pelvic and perineal pain: Secondary | ICD-10-CM | POA: Insufficient documentation

## 2023-09-12 DIAGNOSIS — M791 Myalgia, unspecified site: Secondary | ICD-10-CM | POA: Insufficient documentation

## 2023-09-12 DIAGNOSIS — M26659 Arthropathy of unspecified temporomandibular joint: Secondary | ICD-10-CM | POA: Insufficient documentation

## 2023-09-12 DIAGNOSIS — Z62819 Personal history of unspecified abuse in childhood: Secondary | ICD-10-CM | POA: Insufficient documentation

## 2023-09-12 DIAGNOSIS — F411 Generalized anxiety disorder: Secondary | ICD-10-CM | POA: Insufficient documentation

## 2023-09-12 DIAGNOSIS — F322 Major depressive disorder, single episode, severe without psychotic features: Secondary | ICD-10-CM | POA: Insufficient documentation

## 2023-09-12 NOTE — Assessment & Plan Note (Addendum)
 Check CK and sed rate and review records in more detail to see what Rheumatology feels [name] generalized muscle pain, has been to rheumatology and wishes to return for ongoing eval

## 2023-09-12 NOTE — Assessment & Plan Note (Signed)
 Evaluated in 07/2022 by Rheumatology , advised to return if symptoms worsen or fail to I'm[prove Now c/o generalized muscle pains, though ESR and CK are negative referring based on reported symptom

## 2023-09-12 NOTE — Assessment & Plan Note (Signed)
 Mildly symptomatic, recommend stop gum and use cold compress and tylenol  if needed
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
## 2023-09-12 NOTE — Assessment & Plan Note (Signed)
 Requests and needs therapy , will refer

## 2023-09-12 NOTE — Assessment & Plan Note (Signed)
 Refer Ortho to eval and manage

## 2023-09-12 NOTE — Assessment & Plan Note (Signed)
 Reports chronic pelvic pain, swab for infection and pelvic US  with f/u with PCP

## 2023-09-12 NOTE — Assessment & Plan Note (Signed)
 Start lexapro  and buspar  , refer therapy

## 2023-09-12 NOTE — Addendum Note (Signed)
 Addended by: ANTONETTA ROLLENE BRAVO on: 09/12/2023 10:09 AM   Modules accepted: Orders

## 2023-09-12 NOTE — Assessment & Plan Note (Signed)
 Start lexapr0, refer therapy and close f/u with PCP

## 2023-09-13 NOTE — Addendum Note (Signed)
 Addended by: ANTONETTA ROLLENE BRAVO on: 09/13/2023 08:49 PM   Modules accepted: Level of Service

## 2023-09-16 ENCOUNTER — Telehealth: Payer: Self-pay | Admitting: Family Medicine

## 2023-09-16 NOTE — Telephone Encounter (Signed)
 Attempted to reach pt to schedule with [place]- pt hung up the phone

## 2023-09-17 ENCOUNTER — Ambulatory Visit (HOSPITAL_COMMUNITY)
Admission: RE | Admit: 2023-09-17 | Discharge: 2023-09-17 | Disposition: A | Discharge status: Home / Self Care | Source: Ambulatory Visit | Attending: Family Medicine | Admitting: Family Medicine

## 2023-09-17 DIAGNOSIS — R102 Pelvic and perineal pain: Secondary | ICD-10-CM | POA: Diagnosis not present

## 2023-09-17 DIAGNOSIS — G8929 Other chronic pain: Secondary | ICD-10-CM | POA: Diagnosis not present

## 2023-09-21 ENCOUNTER — Telehealth: Payer: Self-pay

## 2023-09-21 NOTE — Telephone Encounter (Signed)
 Awaiting ultrasound results

## 2023-09-21 NOTE — Telephone Encounter (Signed)
 Copied from CRM [id number]. Topic: Clinical - Request for Lab/Test Order >> Sep 21, 2023  8:34 AM [place] B wrote: Reason for CRM: Patient called in for ultrasound results. Please cb with results
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
## 2023-09-24 ENCOUNTER — Ambulatory Visit: Admitting: Internal Medicine

## 2023-09-29 ENCOUNTER — Ambulatory Visit: Admitting: Orthopedic Surgery

## 2023-09-29 ENCOUNTER — Other Ambulatory Visit (INDEPENDENT_AMBULATORY_CARE_PROVIDER_SITE_OTHER): Payer: Self-pay

## 2023-09-29 ENCOUNTER — Encounter: Payer: Self-pay | Admitting: Orthopedic Surgery

## 2023-09-29 DIAGNOSIS — M25561 Pain in right knee: Secondary | ICD-10-CM

## 2023-09-29 DIAGNOSIS — M79605 Pain in left leg: Secondary | ICD-10-CM

## 2023-09-29 DIAGNOSIS — M79604 Pain in right leg: Secondary | ICD-10-CM | POA: Diagnosis not present

## 2023-09-29 DIAGNOSIS — M25562 Pain in left knee: Secondary | ICD-10-CM
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
## 2023-09-30 ENCOUNTER — Other Ambulatory Visit: Payer: Self-pay | Admitting: Family Medicine

## 2023-09-30 ENCOUNTER — Other Ambulatory Visit: Payer: Self-pay | Admitting: Internal Medicine

## 2023-09-30 ENCOUNTER — Telehealth: Payer: Self-pay | Admitting: Orthopedic Surgery

## 2023-09-30 DIAGNOSIS — E559 Vitamin D deficiency, unspecified: Secondary | ICD-10-CM

## 2023-09-30 DIAGNOSIS — M722 Plantar fascial fibromatosis: Secondary | ICD-10-CM

## 2023-09-30 NOTE — Telephone Encounter (Signed)
 Patient called and said she wants to know if you could give her a refill on the medication you gave her for the cysts in the back of the knee. She also wanted to know if it was ok to go to a chiropractor? [phone]

## 2023-10-01 NOTE — Telephone Encounter (Signed)
 Okay for chiropractor.  You know what we gave her for her knee cyst?

## 2023-10-07 ENCOUNTER — Other Ambulatory Visit: Payer: Self-pay | Admitting: Orthopedic Surgery

## 2023-10-07 MED ORDER — MELOXICAM 15 MG PO TABS: 15.0000 mg | ORAL_TABLET | Freq: Every day | ORAL | 1 refills | Status: AC

## 2023-10-07 NOTE — Telephone Encounter (Signed)
 Mobic  sent ok for cp thx

## 2023-10-08 NOTE — Telephone Encounter (Signed)
 Called and advised pt.

## 2023-10-15 ENCOUNTER — Institutional Professional Consult (permissible substitution): Admitting: Professional Counselor

## 2023-10-18 ENCOUNTER — Encounter: Payer: Self-pay | Admitting: Orthopedic Surgery

## 2023-10-20 ENCOUNTER — Ambulatory Visit
Admission: RE | Admit: 2023-10-20 | Discharge: 2023-10-20 | Disposition: A | Discharge status: Home / Self Care | Source: Ambulatory Visit | Attending: Orthopedic Surgery | Admitting: Orthopedic Surgery

## 2023-10-20 ENCOUNTER — Telehealth: Payer: Self-pay | Admitting: Orthopedic Surgery

## 2023-10-20 ENCOUNTER — Telehealth: Payer: Self-pay

## 2023-10-20 DIAGNOSIS — M4726 Other spondylosis with radiculopathy, lumbar region: Secondary | ICD-10-CM | POA: Diagnosis not present

## 2023-10-20 DIAGNOSIS — M48061 Spinal stenosis, lumbar region without neurogenic claudication: Secondary | ICD-10-CM | POA: Diagnosis not present

## 2023-10-20 DIAGNOSIS — M25561 Pain in right knee: Secondary | ICD-10-CM | POA: Diagnosis not present

## 2023-10-20 DIAGNOSIS — M79605 Pain in left leg: Secondary | ICD-10-CM

## 2023-10-20 DIAGNOSIS — M25562 Pain in left knee: Secondary | ICD-10-CM

## 2023-10-20 DIAGNOSIS — M5116 Intervertebral disc disorders with radiculopathy, lumbar region: Secondary | ICD-10-CM | POA: Diagnosis not present

## 2023-10-20 DIAGNOSIS — M1711 Unilateral primary osteoarthritis, right knee: Secondary | ICD-10-CM | POA: Diagnosis not present

## 2023-10-20 NOTE — Telephone Encounter (Signed)
 Patient called and said she is in extreme pain and the advil  and aleve isn't helping. She wants to know what else can be done. [phone]

## 2023-10-20 NOTE — Telephone Encounter (Signed)
 Copied from CRM [id number]. Topic: Clinical - Medication Question >> Oct 20, 2023  2:51 PM Carlatta H wrote: Reason for CRM: Patient would like to know if there is an alternate medication she can take for busPIRone  (BUSPAR ) 5 MG tablet [[id number]]//She does not like side effects

## 2023-10-21 ENCOUNTER — Ambulatory Visit: Payer: Self-pay | Admitting: Orthopedic Surgery

## 2023-10-21 ENCOUNTER — Other Ambulatory Visit: Payer: Self-pay | Admitting: Orthopedic Surgery

## 2023-10-21 MED ORDER — ACETAMINOPHEN-CODEINE 300-30 MG PO TABS: 1.0000 | ORAL_TABLET | ORAL | 0 refills | Status: AC | PRN

## 2023-10-21 NOTE — Telephone Encounter (Signed)
T3 sent thx

## 2023-10-21 NOTE — Progress Notes (Signed)
 Pls order stat t spine mri thx and pls send back to me for reminder to call thx

## 2023-10-22 ENCOUNTER — Ambulatory Visit: Payer: Self-pay

## 2023-10-22 ENCOUNTER — Encounter: Payer: Self-pay | Admitting: Orthopedic Surgery

## 2023-10-22 ENCOUNTER — Other Ambulatory Visit: Payer: Self-pay

## 2023-10-22 DIAGNOSIS — M79604 Pain in right leg: Secondary | ICD-10-CM

## 2023-10-22 NOTE — Telephone Encounter (Signed)
  FYI Only or Action Required?: Action required by provider: update on patient condition and request for documentation or forms.  Patient was last seen in primary care on 09/08/2023 by Antonetta Rollene BRAVO, MD.  Called Nurse Triage reporting Anxiety.  Symptoms are: gradually improving.  Triage Disposition: Call PCP When Office is Open  Patient/caregiver understands and will follow disposition?: Yes               Copied from CRM #[id number]. Topic: Clinical - Red Word Triage >> Oct 22, 2023  1:25 PM Rosaria BRAVO wrote: [facility] that prompted transfer to Nurse Triage: Anxiety/Depression needs doctors note for school. Reason for Disposition  [1] Caller requesting NON-URGENT health information AND [2] PCP's office is the best resource  Answer Assessment - Initial Assessment Questions Patient states that she was seen a month ago and diagnosed with anxiety/depression. Patient is on her last course to graduate. She was withdrawn from the only class that she needed. The course ended last week The school states that the patient needs to submit a medical leave request from her PCP and if it could be submitted by next Friday September 19th her withdrawn status could be reversed. The school would like the doctor's note to specify dates August 13th, 2025-November 12th, 2025 so she can save her credits and not be withdrawn from the entire school at this time. The date range helps her to be a present student until the course is available again that she needs Patient has an email that she could forward to her provider if needed from the school saying what needs to be done Four days ago the withdrawal went through Patient states that she was not able to log in while she was getting her anxiety under control  Patient states that with her anxiety is better under control at this time but needing assistance with school at this time. She is advised that if anything changes to call us  back and if  anything worsens the Emergency Room is available if needed. Patient was advised of turnaround time for paperwork requests if completed possibly being 7-10 days and she was understanding.  Protocols used: Information Only Call - No Triage-A-AH

## 2023-10-23 ENCOUNTER — Other Ambulatory Visit: Payer: Self-pay | Admitting: Family Medicine

## 2023-10-23 DIAGNOSIS — F411 Generalized anxiety disorder: Secondary | ICD-10-CM

## 2023-10-23 MED ORDER — HYDROXYZINE PAMOATE 25 MG PO CAPS: 25.0000 mg | ORAL_CAPSULE | Freq: Three times a day (TID) | ORAL | 1 refills | Status: AC | PRN

## 2023-10-23 NOTE — Progress Notes (Signed)
 Thx very much

## 2023-10-25 NOTE — Telephone Encounter (Signed)
 Message sent to patient

## 2023-10-26 NOTE — Progress Notes (Signed)
 Hi Lauren.  I called her.  Her pain is accelerating to some degree.  She has had a bad experience in the past with an epidural steroid injection.  Would you mind seeing if Dr. Georgina can see her.  Thoracic spine MRI is pending but it does look like she has got some compression at T10-T11 as well as L4-L5.  Thanks

## 2023-10-27 ENCOUNTER — Other Ambulatory Visit

## 2023-10-27 ENCOUNTER — Telehealth: Payer: Self-pay

## 2023-10-27 NOTE — Telephone Encounter (Signed)
 Copied from CRM [id number]. Topic: General - Other >> Oct 27, 2023  2:21 PM [name] wrote: Reason for CRM: Patient is calling in checking on the status of a medical leave request from her PCP for Anxiety for school. Patient says it needs to be sent to the school by this Friday 10/29/23. Please follow up with patient.

## 2023-10-28 NOTE — Progress Notes (Signed)
 Scheduled for 11/29/23 @ 3pm

## 2023-10-29 ENCOUNTER — Encounter: Payer: Self-pay | Admitting: Family Medicine

## 2023-10-29 ENCOUNTER — Telehealth: Admitting: Family Medicine

## 2023-10-29 DIAGNOSIS — F411 Generalized anxiety disorder: Secondary | ICD-10-CM

## 2023-10-29 NOTE — Assessment & Plan Note (Addendum)
    09/08/2023    4:22 PM 04/16/2022    9:40 AM 03/06/2020    8:22 AM 02/15/2020    4:27 PM  GAD 7 : Generalized Anxiety Score  Nervous, Anxious, on Edge 3 1 0 0  Control/stop worrying 3 0 0 0  Worry too much - different things 3 0 0 0  Trouble relaxing 3 0 0 0  Restless 3 2 0 0  Easily annoyed or irritable 3 1 0 0  Afraid - awful might happen 3 1 0 0  Total GAD 7 Score 21 5 0 0  Anxiety Difficulty Extremely difficult Somewhat difficult      The patient reports previously taking Lexapro  and Buspar  but discontinued both due to side effects, including headaches and drowsiness. Plan: Start hydroxyzine  25 mg PO as needed for anxiety. Follow up to reassess symptoms and medication tolerance Continued discussion on lifestyle changes, establishing a daily routine, going outdoors, exercise, healthy eating habits, mindfulness and mediatation.

## 2023-10-29 NOTE — Telephone Encounter (Signed)
 Duplicate message, sent to pcp for review

## 2023-10-29 NOTE — Progress Notes (Signed)
 Virtual Visit via Video Note  I connected with [name] on 10/29/23 at  2:00 PM EDT by a video enabled telemedicine application and verified that I am speaking with the correct person using two identifiers.  Patient Location: Home Provider Location: Office/Clinic  I discussed the limitations, risks, security, and privacy concerns of performing an evaluation and management service by video and the availability of in person appointments. I also discussed with the patient that there may be a patient responsible charge related to this service. The patient expressed understanding and agreed to proceed.  Subjective: PCP: Zarwolo, Gloria, FNP  No chief complaint on file.  HPI Anxiety - Follow-Up Visit: The patient presents for a follow-up visit for anxiety. Current symptoms include excessive worry, irritability, muscle tension, racing thoughts, nervous/anxious behavior, panic episodes, and restlessness. Symptoms occur occasionally but cause significant distress and interfere with daily functioning. The patient reports sleeping approximately 6 hours per night.   ROS: Per HPI  Current Outpatient Medications:    acetaminophen -codeine  (TYLENOL  #3) 300-30 MG tablet, Take 1 tablet by mouth every 4 (four) hours as needed for moderate pain (pain score 4-6)., Disp: 30 tablet, Rfl: 0   meloxicam  (MOBIC ) 15 MG tablet, Take 1 tablet (15 mg total) by mouth daily., Disp: 30 tablet, Rfl: 1   escitalopram  (LEXAPRO ) 10 MG tablet, Take 1 tablet (10 mg total) by mouth daily., Disp: 30 tablet, Rfl: 3   hydrOXYzine  (VISTARIL ) 25 MG capsule, Take 1 capsule (25 mg total) by mouth every 8 (eight) hours as needed for anxiety., Disp: 30 capsule, Rfl: 1   predniSONE  (DELTASONE ) 20 MG tablet, Take 2 tablets (40 mg total) by mouth daily with breakfast. (Patient not taking: Reported on 09/08/2023), Disp: 10 tablet, Rfl: 0   Vitamin D , Ergocalciferol , (DRISDOL ) 1.25 MG (50000 UNIT) CAPS capsule, TAKE 1 CAPSULE  (50,000 UNITS TOTAL) BY MOUTH EVERY 7 (SEVEN) DAYS, Disp: 10 capsule, Rfl: 1  Observations/Objective: There were no vitals filed for this visit. Physical Exam Patient is alert and no acute distress noted.    Assessment and Plan: GAD (generalized anxiety disorder) Assessment & Plan:    09/08/2023    4:22 PM 04/16/2022    9:40 AM 03/06/2020    8:22 AM 02/15/2020    4:27 PM  GAD 7 : Generalized Anxiety Score  Nervous, Anxious, on Edge 3 1 0 0  Control/stop worrying 3 0 0 0  Worry too much - different things 3 0 0 0  Trouble relaxing 3 0 0 0  Restless 3 2 0 0  Easily annoyed or irritable 3 1 0 0  Afraid - awful might happen 3 1 0 0  Total GAD 7 Score 21 5 0 0  Anxiety Difficulty Extremely difficult Somewhat difficult      The patient reports previously taking Lexapro  and Buspar  but discontinued both due to side effects, including headaches and drowsiness. Plan: Start hydroxyzine  25 mg PO as needed for anxiety. Follow up to reassess symptoms and medication tolerance Continued discussion on lifestyle changes, establishing a daily routine, going outdoors, exercise, healthy eating habits, mindfulness and mediatation.      Follow Up Instructions: No follow-ups on file.   I discussed the assessment and treatment plan with the patient. The patient was provided an opportunity to ask questions, and all were answered. The patient agreed with the plan and demonstrated an understanding of the instructions.   The patient was advised to call back or seek an in-person evaluation if the symptoms worsen  or if the condition fails to improve as anticipated.  The above assessment and management plan was discussed with the patient. The patient verbalized understanding of and has agreed to the management plan.   Hilario Kidd Wilhelmena Falter, FNP

## 2023-10-29 NOTE — Telephone Encounter (Signed)
 Scheduled video visit [name] today patient very upset needs letter for her school today.

## 2023-10-30 ENCOUNTER — Ambulatory Visit
Admission: RE | Admit: 2023-10-30 | Discharge: 2023-10-30 | Disposition: A | Discharge status: Home / Self Care | Source: Ambulatory Visit | Attending: Orthopedic Surgery | Admitting: Orthopedic Surgery

## 2023-10-30 DIAGNOSIS — M79605 Pain in left leg: Secondary | ICD-10-CM

## 2023-10-30 DIAGNOSIS — M47814 Spondylosis without myelopathy or radiculopathy, thoracic region: Secondary | ICD-10-CM | POA: Diagnosis not present

## 2023-11-18 NOTE — Progress Notes (Signed)
 [name]                                          MRN: [id number]   11/18/2023   The [facility] Quality Team Specialist reviewed this patient medical record for the purposes of chart review for care gap closure. The following were reviewed: abstraction for care gap closure-cervical cancer screening.    [facility] Quality Team

## 2023-11-20 ENCOUNTER — Encounter

## 2023-11-29 ENCOUNTER — Ambulatory Visit: Admitting: Orthopedic Surgery

## 2023-11-29 ENCOUNTER — Other Ambulatory Visit: Payer: Self-pay

## 2023-11-29 VITALS — BP 129/76 | HR 73 | Ht 69.0 in | Wt 221.6 lb

## 2023-11-29 DIAGNOSIS — M545 Low back pain, unspecified: Secondary | ICD-10-CM | POA: Diagnosis not present

## 2023-11-29 DIAGNOSIS — M5416 Radiculopathy, lumbar region: Secondary | ICD-10-CM

## 2023-11-29 MED ORDER — METHOCARBAMOL 500 MG PO TABS: 500.0000 mg | ORAL_TABLET | Freq: Four times a day (QID) | ORAL | 0 refills | Status: AC | PRN

## 2023-11-29 NOTE — Progress Notes (Signed)
 Orthopedic Spine Surgery Office Note  Assessment: Patient is a 33 y.o. 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 Discussed that the conservative treatments include:  -activity modification  -physical therapy  -over the counter pain medications  -medrol dosepak  -lyrica/gabapentin  -lumbar steroid injections -Patient has tried tylenol  #3  -Recommended PT.  Referral provided to her today.  Prescribed methocarbamol.  Can continue with the tylenol  #3 -Patient should return to office in 8 weeks, x-rays at next visit: none   Patient expressed understanding of the plan and all questions were answered to the patient's satisfaction.   ___________________________________________________________________________   History:  Patient is a 33 y.o. female who presents today for lumbar spine.  Patient has had about 8 months of low back pain.  It has gotten worse over time and has moved into the left lower extremity.  She feels it going deep into the left thigh and on the lateral aspect of the left leg.  She has no pain radiating into the right lower extremity.  She has tried sparing use of Tylenol  3 as a treatment but has not tried any other treatments.  There was no trauma or injury that preceded the onset of the pain.   Weakness: Denies Symptoms of imbalance: Denies Paresthesias and numbness: Denies Bowel or bladder incontinence: Denies Saddle anesthesia: Denies  Treatments tried: tylenol  #3  Review of systems: Denies fevers and chills, night sweats, unexplained weight loss, history of cancer, pain that wakes her at night  Past medical history: Depression/anxiety Right knee baker's cyst  Allergies: NKDA  Past surgical history:   Cholecystectomy C-section  Social history: Denies use of nicotine product (smoking, vaping, patches, smokeless) Alcohol use: Yes, with less than 1 drink per week Reports marijuana use, denies other recreational drug use   Physical Exam:  BMI of 32.7  General: no acute distress, appears stated age Neurologic: alert, answering questions appropriately, following commands Respiratory: unlabored breathing on room air, symmetric chest rise Psychiatric: appropriate affect, normal cadence to speech   MSK (spine):  -Strength exam      Left  Right EHL    5/5  5/5 TA    5/5  5/5 GSC    5/5  5/5 Knee extension  5/5  5/5 Hip flexion   5/5  5/5  -Sensory exam    Sensation intact to light touch in L3-S1 nerve distributions of bilateral lower extremities  -Achilles DTR: 2/4 on the left, 2/4 on the right -Patellar tendon DTR: 2/4 on the left, 2/4 on the right  -Straight leg raise: negative bilaterally -Clonus: no beats bilaterally  -Left hip exam: No pain through range of motion, negative Stinchfield, negative FABER, negative SI joint compression test -Right hip exam: No pain through range of motion, negative Stinchfield, negative FABER, negative SI joint compression test  Imaging: XRs of the lumbar spine from 11/29/2023 were independently reviewed and interpreted, showing no significant degenerative changes.  No evidence of instability on flexion/tension views.  No fracture or dislocation seen.  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  Central and bilateral lateral recess stenosis at L4/5.  No other significant stenosis seen.   Patient name: [name] Patient MRN: [id number]  Date of visit: 11/29/23

## 2023-12-06 ENCOUNTER — Ambulatory Visit: Admitting: Family Medicine

## 2023-12-13 ENCOUNTER — Encounter: Payer: Self-pay | Admitting: Radiology
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
## 2023-12-22 ENCOUNTER — Ambulatory Visit: Attending: Orthopedic Surgery

## 2023-12-22 ENCOUNTER — Other Ambulatory Visit: Payer: Self-pay

## 2023-12-22 DIAGNOSIS — M6281 Muscle weakness (generalized): Secondary | ICD-10-CM | POA: Diagnosis not present

## 2023-12-22 DIAGNOSIS — M5416 Radiculopathy, lumbar region: Secondary | ICD-10-CM | POA: Diagnosis not present

## 2023-12-22 DIAGNOSIS — M5459 Other low back pain: Secondary | ICD-10-CM | POA: Insufficient documentation
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
## 2024-01-05 ENCOUNTER — Ambulatory Visit

## 2024-01-05 DIAGNOSIS — M5459 Other low back pain: Secondary | ICD-10-CM | POA: Diagnosis not present

## 2024-01-05 DIAGNOSIS — M6281 Muscle weakness (generalized): Secondary | ICD-10-CM

## 2024-01-05 DIAGNOSIS — M5416 Radiculopathy, lumbar region: Secondary | ICD-10-CM | POA: Diagnosis not present

## 2024-01-11 ENCOUNTER — Ambulatory Visit

## 2024-01-11 DIAGNOSIS — M6281 Muscle weakness (generalized): Secondary | ICD-10-CM | POA: Diagnosis present

## 2024-01-11 DIAGNOSIS — M5459 Other low back pain: Secondary | ICD-10-CM | POA: Diagnosis present
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
## 2024-01-13 ENCOUNTER — Ambulatory Visit
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
## 2024-01-18 ENCOUNTER — Ambulatory Visit

## 2024-01-18 DIAGNOSIS — M5459 Other low back pain: Secondary | ICD-10-CM

## 2024-01-18 DIAGNOSIS — M6281 Muscle weakness (generalized): Secondary | ICD-10-CM

## 2024-01-20 ENCOUNTER — Ambulatory Visit

## 2024-01-20 DIAGNOSIS — M5459 Other low back pain: Secondary | ICD-10-CM | POA: Diagnosis not present

## 2024-01-20 DIAGNOSIS — M6281 Muscle weakness (generalized): Secondary | ICD-10-CM
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
## 2024-01-25 ENCOUNTER — Ambulatory Visit

## 2024-01-25 DIAGNOSIS — M6281 Muscle weakness (generalized): Secondary | ICD-10-CM

## 2024-01-25 DIAGNOSIS — M5459 Other low back pain: Secondary | ICD-10-CM | POA: Diagnosis not present

## 2024-01-27 ENCOUNTER — Ambulatory Visit

## 2024-01-27 ENCOUNTER — Telehealth: Payer: Self-pay

## 2024-01-27 NOTE — Telephone Encounter (Signed)
 TC due to missed visit.  Spoke directly to patient who is sick snf forgot today's appointment.  Reminded her to call the front office to schedule additional visits.

## 2024-02-29 ENCOUNTER — Ambulatory Visit: Admitting: Family Medicine

## 2024-03-02 ENCOUNTER — Encounter: Payer: Self-pay | Admitting: Family Medicine

## 2024-03-02 ENCOUNTER — Telehealth: Admitting: Family Medicine

## 2024-03-02 DIAGNOSIS — R7301 Impaired fasting glucose: Secondary | ICD-10-CM

## 2024-03-02 DIAGNOSIS — E038 Other specified hypothyroidism: Secondary | ICD-10-CM | POA: Diagnosis not present

## 2024-03-02 DIAGNOSIS — E559 Vitamin D deficiency, unspecified: Secondary | ICD-10-CM | POA: Diagnosis not present

## 2024-03-02 DIAGNOSIS — F411 Generalized anxiety disorder: Secondary | ICD-10-CM | POA: Diagnosis not present

## 2024-03-02 DIAGNOSIS — E7849 Other hyperlipidemia: Secondary | ICD-10-CM

## 2024-03-02 MED ORDER — ALPRAZOLAM 0.25 MG PO TABS: 0.2500 mg | ORAL_TABLET | ORAL | 0 refills | Status: AC | PRN

## 2024-03-02 NOTE — Assessment & Plan Note (Signed)
 The patient denies suicidal ideation (SI), homicidal ideation (HI), and auditory or visual hallucinations (AVH). Discussed the benefits and potential side effects of benzodiazepines, including the risks of dependence and addiction. Reviewed that side effects may require dose modification, and the patient was instructed to take the medication only as needed. The patient was advised not to take Xanax  (alprazolam ) with narcotics or alcohol. She was informed that the medication typically begins working within 30-60 minutes, with peak effects at 1-2 hours. The patient was advised that refills will be limited to 12 tablets per month, and refills will be provided only if clinically appropriate. The patient verbalized understanding and is aware of the plan of care. Follow-up scheduled in six weeks to assess treatment effectiveness.    03/02/2024    1:48 PM 09/08/2023    4:22 PM 04/16/2022    9:40 AM 03/06/2020    8:22 AM  GAD 7 : Generalized Anxiety Score  Nervous, Anxious, on Edge 3 3  1   0   Control/stop worrying 3 3  0  0   Worry too much - different things 3 3  0  0   Trouble relaxing 3 3  0  0   Restless 3 3  2   0   Easily annoyed or irritable 3 3  1   0   Afraid - awful might happen 3 3  1   0   Total GAD 7 Score 21 21 5  0  Anxiety Difficulty Somewhat difficult Extremely difficult Somewhat difficult      Data saved with a previous flowsheet row definition

## 2024-03-02 NOTE — Progress Notes (Signed)
 "  Virtual Visit via Video Note  I connected with [name] on 03/02/24 at  2:20 PM EST by a video enabled telemedicine application and verified that I am speaking with the correct person using two identifiers.  Patient Location: Home Provider Location: Home Office  I discussed the limitations, risks, security, and privacy concerns of performing an evaluation and management service by video and the availability of in person appointments. I also discussed with the patient that there may be a patient responsible charge related to this service. The patient expressed understanding and agreed to proceed.  Subjective: PCP: Edman Meade PEDLAR, FNP  Chief Complaint  Patient presents with   Follow-up    Follow up   HPI The patient reports that she discontinued hydroxyzine  25 mg as needed for anxiety due to side effects of drowsiness. She states that she is seeking an as-needed medication to help quiet her racing thoughts, noting that her mind is constantly on the go, though symptoms are intermittent rather than constant.  She reports that her depressive symptoms are well controlled and were primarily situational, but identifies anxiety as her primary concern at this time. She is a single mother of three and describes a busy lifestyle with ongoing stressors.  The patient reports currently taking over-the-counter supplements, including magnesium  and vitamin B12, to help manage anxiety. She expresses a preference to avoid SSRIs, citing reluctance to take a daily medication regimen.    ROS: Per HPI Current Medications[1]  Observations/Objective: There were no vitals filed for this visit. Physical Exam Patient is well-developed, well-nourished in no acute distress.  Resting comfortably at home.  Head is normocephalic, atraumatic.  No labored breathing.  Speech is clear and coherent with logical content.  Patient is alert and oriented at baseline.   Assessment and Plan: GAD  (generalized anxiety disorder) Assessment & Plan: The patient denies suicidal ideation (SI), homicidal ideation (HI), and auditory or visual hallucinations (AVH). Discussed the benefits and potential side effects of benzodiazepines, including the risks of dependence and addiction. Reviewed that side effects may require dose modification, and the patient was instructed to take the medication only as needed. The patient was advised not to take Xanax  (alprazolam ) with narcotics or alcohol. She was informed that the medication typically begins working within 30-60 minutes, with peak effects at 1-2 hours. The patient was advised that refills will be limited to 12 tablets per month, and refills will be provided only if clinically appropriate. The patient verbalized understanding and is aware of the plan of care. Follow-up scheduled in six weeks to assess treatment effectiveness.    03/02/2024    1:48 PM 09/08/2023    4:22 PM 04/16/2022    9:40 AM 03/06/2020    8:22 AM  GAD 7 : Generalized Anxiety Score  Nervous, Anxious, on Edge 3 3  1   0   Control/stop worrying 3 3  0  0   Worry too much - different things 3 3  0  0   Trouble relaxing 3 3  0  0   Restless 3 3  2   0   Easily annoyed or irritable 3 3  1   0   Afraid - awful might happen 3 3  1   0   Total GAD 7 Score 21 21 5  0  Anxiety Difficulty Somewhat difficult Extremely difficult Somewhat difficult      Data saved with a previous flowsheet row definition      Orders: -     ALPRAZolam ;  Take 1 tablet (0.25 mg total) by mouth as needed for anxiety.  Dispense: 12 tablet; Refill: 0 -     Magnesium  -     Vitamin B12  IFG (impaired fasting glucose) -     Hemoglobin A1c  Vitamin D  deficiency -     VITAMIN D  25 Hydroxy (Vit-D Deficiency, Fractures)  TSH (thyroid-stimulating hormone deficiency) -     TSH + free T4  Other hyperlipidemia -     Lipid panel -     CMP14+EGFR -     CBC with Differential/Platelet  Note: This chart has been  completed using [employer] (consulting) software, and while attempts have been made to ensure accuracy, certain words and phrases may not be transcribed as intended.    Follow Up Instructions: No follow-ups on file.   I discussed the assessment and treatment plan with the patient. The patient was provided an opportunity to ask questions, and all were answered. The patient agreed with the plan and demonstrated an understanding of the instructions.   The patient was advised to call back or seek an in-person evaluation if the symptoms worsen or if the condition fails to improve as anticipated.  The above assessment and management plan was discussed with the patient. The patient verbalized understanding of and has agreed to the management plan.   Juandedios Dudash  Z Bacchus, FNP     [1]  Current Outpatient Medications:    acetaminophen -codeine  (TYLENOL  #3) 300-30 MG tablet, Take 1 tablet by mouth every 4 (four) hours as needed for moderate pain (pain score 4-6)., Disp: 30 tablet, Rfl: 0   ALPRAZolam  (XANAX ) 0.25 MG tablet, Take 1 tablet (0.25 mg total) by mouth as needed for anxiety., Disp: 12 tablet, Rfl: 0   escitalopram  (LEXAPRO ) 10 MG tablet, Take 1 tablet (10 mg total) by mouth daily., Disp: 30 tablet, Rfl: 3   hydrOXYzine  (VISTARIL ) 25 MG capsule, Take 1 capsule (25 mg total) by mouth every 8 (eight) hours as needed for anxiety., Disp: 30 capsule, Rfl: 1   meloxicam  (MOBIC ) 15 MG tablet, Take 1 tablet (15 mg total) by mouth daily., Disp: 30 tablet, Rfl: 1   methocarbamol  (ROBAXIN ) 500 MG tablet, Take 1 tablet (500 mg total) by mouth every 6 (six) hours as needed (pain, muscle spasms)., Disp: 50 tablet, Rfl: 0   predniSONE  (DELTASONE ) 20 MG tablet, Take 2 tablets (40 mg total) by mouth daily with breakfast., Disp: 10 tablet, Rfl: 0   Vitamin D , Ergocalciferol , (DRISDOL ) 1.25 MG (50000 UNIT) CAPS capsule, TAKE 1 CAPSULE (50,000 UNITS TOTAL) BY MOUTH EVERY 7 (SEVEN) DAYS, Disp: 10 capsule, Rfl: 1  "

## 2024-04-13 ENCOUNTER — Ambulatory Visit: Payer: Self-pay | Admitting: Family Medicine
